# Patient Record
Sex: Male | Born: 1950 | Race: White | Hispanic: No | Marital: Married | Smoking: Never smoker
Health system: Southern US, Community
[De-identification: ages and names within clinical notes are randomized; demographics above are authoritative.]

## PROBLEM LIST (undated history)

## (undated) DIAGNOSIS — I480 Paroxysmal atrial fibrillation: Secondary | ICD-10-CM

## (undated) DIAGNOSIS — K219 Gastro-esophageal reflux disease without esophagitis: Secondary | ICD-10-CM

## (undated) DIAGNOSIS — G4730 Sleep apnea, unspecified: Secondary | ICD-10-CM

## (undated) DIAGNOSIS — I10 Essential (primary) hypertension: Secondary | ICD-10-CM

## (undated) DIAGNOSIS — D649 Anemia, unspecified: Secondary | ICD-10-CM

## (undated) DIAGNOSIS — D689 Coagulation defect, unspecified: Secondary | ICD-10-CM

## (undated) HISTORY — DX: Anemia, unspecified: D64.9

## (undated) HISTORY — DX: Coagulation defect, unspecified: D68.9

---

## 1984-11-19 HISTORY — PX: KNEE SURGERY: SHX244

## 2000-11-19 HISTORY — PX: APPENDECTOMY: SHX54

## 2004-11-19 HISTORY — PX: COLONOSCOPY: SHX174

## 2006-09-13 ENCOUNTER — Ambulatory Visit: Payer: Self-pay | Admitting: Gastroenterology

## 2006-12-13 ENCOUNTER — Ambulatory Visit: Payer: Self-pay | Admitting: Gastroenterology

## 2014-08-03 DIAGNOSIS — K219 Gastro-esophageal reflux disease without esophagitis: Secondary | ICD-10-CM | POA: Insufficient documentation

## 2014-08-03 DIAGNOSIS — E785 Hyperlipidemia, unspecified: Secondary | ICD-10-CM | POA: Insufficient documentation

## 2014-08-03 DIAGNOSIS — E8881 Metabolic syndrome: Secondary | ICD-10-CM | POA: Insufficient documentation

## 2014-08-03 DIAGNOSIS — J309 Allergic rhinitis, unspecified: Secondary | ICD-10-CM | POA: Insufficient documentation

## 2015-11-20 DIAGNOSIS — I82409 Acute embolism and thrombosis of unspecified deep veins of unspecified lower extremity: Secondary | ICD-10-CM

## 2015-11-20 HISTORY — DX: Acute embolism and thrombosis of unspecified deep veins of unspecified lower extremity: I82.409

## 2016-01-24 ENCOUNTER — Other Ambulatory Visit: Payer: Self-pay | Admitting: Family Medicine

## 2016-01-24 DIAGNOSIS — M79605 Pain in left leg: Secondary | ICD-10-CM

## 2016-01-27 ENCOUNTER — Ambulatory Visit
Admission: RE | Admit: 2016-01-27 | Discharge: 2016-01-27 | Disposition: A | Discharge status: Home / Self Care | Payer: BC Managed Care – PPO | Source: Ambulatory Visit | Attending: Family Medicine | Admitting: Family Medicine

## 2016-01-27 DIAGNOSIS — M79605 Pain in left leg: Secondary | ICD-10-CM | POA: Insufficient documentation

## 2016-01-27 DIAGNOSIS — I824Z2 Acute embolism and thrombosis of unspecified deep veins of left distal lower extremity: Secondary | ICD-10-CM | POA: Insufficient documentation

## 2017-02-05 DIAGNOSIS — Z86718 Personal history of other venous thrombosis and embolism: Secondary | ICD-10-CM | POA: Insufficient documentation

## 2017-05-23 ENCOUNTER — Encounter: Payer: Self-pay | Admitting: Oncology

## 2017-05-23 ENCOUNTER — Inpatient Hospital Stay: Payer: BC Managed Care – PPO | Attending: Oncology | Admitting: Oncology

## 2017-05-23 VITALS — BP 134/78 | HR 80 | Temp 98.8°F | Resp 18 | Ht 67.0 in | Wt 223.1 lb

## 2017-05-23 DIAGNOSIS — Z880 Allergy status to penicillin: Secondary | ICD-10-CM | POA: Diagnosis not present

## 2017-05-23 DIAGNOSIS — E8881 Metabolic syndrome: Secondary | ICD-10-CM | POA: Diagnosis not present

## 2017-05-23 DIAGNOSIS — Z809 Family history of malignant neoplasm, unspecified: Secondary | ICD-10-CM | POA: Diagnosis not present

## 2017-05-23 DIAGNOSIS — M1990 Unspecified osteoarthritis, unspecified site: Secondary | ICD-10-CM | POA: Diagnosis not present

## 2017-05-23 DIAGNOSIS — E785 Hyperlipidemia, unspecified: Secondary | ICD-10-CM | POA: Diagnosis not present

## 2017-05-23 DIAGNOSIS — Z86718 Personal history of other venous thrombosis and embolism: Secondary | ICD-10-CM | POA: Diagnosis not present

## 2017-05-23 DIAGNOSIS — E669 Obesity, unspecified: Secondary | ICD-10-CM

## 2017-05-23 DIAGNOSIS — K219 Gastro-esophageal reflux disease without esophagitis: Secondary | ICD-10-CM | POA: Diagnosis not present

## 2017-05-23 DIAGNOSIS — K649 Unspecified hemorrhoids: Secondary | ICD-10-CM | POA: Diagnosis not present

## 2017-05-23 DIAGNOSIS — I10 Essential (primary) hypertension: Secondary | ICD-10-CM | POA: Diagnosis not present

## 2017-05-23 DIAGNOSIS — Z79899 Other long term (current) drug therapy: Secondary | ICD-10-CM | POA: Diagnosis not present

## 2017-05-23 DIAGNOSIS — Z7901 Long term (current) use of anticoagulants: Secondary | ICD-10-CM | POA: Diagnosis not present

## 2017-05-23 DIAGNOSIS — I82502 Chronic embolism and thrombosis of unspecified deep veins of left lower extremity: Secondary | ICD-10-CM

## 2017-05-23 NOTE — Progress Notes (Signed)
Hematology/Oncology Consult note [facility] Telephone:([phone] [phone]  Patient Care Team: Derinda Late, MD as PCP - General (Family Medicine)   Name of the patient: Ryan [name]  [id number]  1951/10/21    Reason for referral- DVT   Referring physician- Dr. Baldemar Lenis  Date of visit: 05/23/17   History of presenting illness- patient is a 66 year old male with a past medical history significant for metabolic syndrome, hypertension, hyperlipidemia and GERD. Presented with left lower extremity pain in March 2017 and underwent an ultrasound of his left leg which showed an occlusive DVT in the calf veins, popliteal vein and femoral vein throughout the thigh.   Nonocclusive thrombus extends centrally into the common femoral vein. He is on Coumadin for the same. He has been sent to [place] for long-term anticoagulation recommendations  Currently he continues to take Coumadin and reports that his INR is within therapeutic limits. However he has been having on and off leaving him with his hemorrhoids. He also has issues with arthritis and would like to take anti-inflammatories as possible which she has not been able to do so as he is on Coumadin. Denies any prior history of blood clots before 2017. No family history of any blood clots. He did not have any long distance trip, surgeries or falls prior to his episode of DVT in 2017  ECOG PS- 0  Pain scale- 0   Review of systems- Review of Systems  Constitutional: Negative for chills, fever, malaise/fatigue and weight loss.  HENT: Negative for congestion, ear discharge and nosebleeds.   Eyes: Negative for blurred vision.  Respiratory: Negative for cough, hemoptysis, sputum production, shortness of breath and wheezing.   Cardiovascular: Negative for chest pain, palpitations, orthopnea and claudication.  Gastrointestinal: Negative for abdominal pain, blood in stool, constipation, diarrhea, heartburn, melena,  nausea and vomiting.  Genitourinary: Negative for dysuria, flank pain, frequency, hematuria and urgency.  Musculoskeletal: Negative for back pain, joint pain and myalgias.  Skin: Negative for rash.  Neurological: Negative for dizziness, tingling, focal weakness, seizures, weakness and headaches.  Endo/Heme/Allergies: Does not bruise/bleed easily.  Psychiatric/Behavioral: Negative for depression and suicidal ideas. The patient does not have insomnia.     Allergies  Allergen Reactions  . Amoxicillin-Pot Clavulanate Diarrhea  . Nitroglycerin Other (See Comments)    Sudden drop in BP    Patient Active Problem List   Diagnosis Date Noted  . History of deep venous thrombosis 02/05/2017  . Allergic rhinitis 08/03/2014  . Esophageal reflux 08/03/2014  . Metabolic syndrome 88/41/6606  . Other and unspecified hyperlipidemia 08/03/2014     Past Medical History:  Diagnosis Date  . Anemia   . Clotting disorder [facility])      Past Surgical History:  Procedure Laterality Date  . APPENDECTOMY    . COLONOSCOPY  2006  . KNEE SURGERY  1986    Social History   Social History  . Marital status: Married    Spouse name: N/A  . Number of children: N/A  . Years of education: N/A   Occupational History  . Not on file.   Social History Main Topics  . Smoking status: Not on file  . Smokeless tobacco: Not on file  . Alcohol use Not on file  . Drug use: Unknown  . Sexual activity: Not on file   Other Topics Concern  . Not on file   Social History Narrative  . No narrative on file     Family History  Problem Relation Age  of Onset  . Cancer Maternal Aunt   . Cancer Paternal Uncle     No current outpatient prescriptions on file.   Physical exam:  Vitals:   05/23/17 1405  BP: 134/78  Pulse: 80  Resp: 18  Temp: 98.8 F (37.1 C)  TempSrc: Tympanic  Weight: 223 lb 1 oz (101.2 kg)  Height: 5\' 7"  (1.702 m)   Physical Exam  Constitutional: He is oriented to person, place,  and time.  Mildly obese, no acute distress  HENT:  Head: Normocephalic and atraumatic.  Eyes: EOM are normal. Pupils are equal, round, and reactive to light.  Neck: Normal range of motion.  Cardiovascular: Normal rate, regular rhythm and normal heart sounds.   Pulmonary/Chest: Effort normal and breath sounds normal.  Abdominal: Soft. Bowel sounds are normal.  Musculoskeletal: He exhibits edema (trace left ankle edema).  Neurological: He is alert and oriented to person, place, and time.  Skin: Skin is warm and dry.       Assessment and plan- Patient is a 66 y.o. male referred to me for unprovoked LLE DVT  1. Discussed the results of the Doppler from March 2017 did not S showed distal lower extremity DVT but also proximal DVT involving the femoral vein which was seemingly unprovoked. His risk factors for recurrence include male sex, obesity, unprovoked proximal DVT. I will therefore highly recommend that he should be on indefinite anticoagulation while periodically weighing the risks and benefits of bleeding versus clotting. Although he has had on and off hemorrhoidal bleeding that has not been hemodynamically significant to cause anemia.   2. Hypercoagulable workup at this time would not change his management as he will need indefinite anticoagulation for above mentioned reasons.   3. Patient would like to think about his risks about stopping anticoagulation versus continuing anticoagulation and discuss this further with [facility] if he wants to come off anticoagulation. I would recommend checking D dimer 2 weeks after stopping coumadin and atleast putting him on low dose aspirin should he decide to come off anticoagulation.. If D dimer is high, his risk of recurrence for another DVT/PE would be even higher  4. Discussed pros and cons of oral anticoagulants xarelto, eliquis and pradaxa. Lesser risk of bleeding compared to coumadin, no monitoring required. Same dose regardless of body weight  (ideally not studied  In pateints >200lb but would be ok in his case) but lack of available reveresal agent for xarelto and eliquis.  5. Malignancy work up not recommended for unprovoked DVT but he should be uptodate with his colonoscopy.    Patient is leaning toward continuing anticoagulation but likely switching to one of the NOACs. He does not require f/u with me at this time   Thank you for this kind referral and the opportunity to participate in the care of this patient   Visit Diagnosis 1. Leg DVT (deep venous thromboembolism), chronic, left (HCC)     Dr. Randa Evens, MD, MPH [place] [facility] Pager- [phone] 05/23/2017  3:22 PM

## 2017-05-23 NOTE — Progress Notes (Signed)
Patient here today as new evaluation regarding lower left leg DVT.  Referred by Dr. Loney Hering.

## 2020-01-16 DIAGNOSIS — I639 Cerebral infarction, unspecified: Secondary | ICD-10-CM

## 2020-01-16 HISTORY — DX: Cerebral infarction, unspecified: I63.9

## 2020-02-24 ENCOUNTER — Other Ambulatory Visit: Payer: Self-pay

## 2020-02-24 ENCOUNTER — Ambulatory Visit: Payer: Medicare PPO | Admitting: Dermatology

## 2020-02-24 DIAGNOSIS — L918 Other hypertrophic disorders of the skin: Secondary | ICD-10-CM

## 2020-02-24 DIAGNOSIS — L719 Rosacea, unspecified: Secondary | ICD-10-CM

## 2020-02-24 DIAGNOSIS — L820 Inflamed seborrheic keratosis: Secondary | ICD-10-CM | POA: Diagnosis not present

## 2020-02-24 DIAGNOSIS — L739 Follicular disorder, unspecified: Secondary | ICD-10-CM

## 2020-02-24 DIAGNOSIS — B078 Other viral warts: Secondary | ICD-10-CM | POA: Diagnosis not present

## 2020-02-24 MED ORDER — CLINDAMYCIN PHOSPHATE 1 % EX SOLN: CUTANEOUS | 3 refills | Status: DC

## 2020-02-24 NOTE — Progress Notes (Signed)
   Follow-Up Visit   Subjective  Ryan [name] is a 69 y.o. male who presents for the following: lesions in the scalp (patient states that he has had this issue for about 10-12 years now. He has been diagnosed with different things by different physicians. He has tried Finacea, Clobetasol, Ketoconazole 2% shampoo, and antibiotics. Non of which helped. ), wart follow up (patient has been using Imiquimod at home but hasn't noticed an improvement. He did get a rash on his L inner arm where squaric acid was applied.), skin tags (irritated, patient would like to discuss treatment options), and recheck previously treated ISK's (patient states that the lesions did scab over but haven't resolved.).  The following portions of the chart were reviewed this encounter and updated as appropriate: Tobacco  Allergies  Meds  Problems  Med Hx  Surg Hx  Fam Hx     Review of Systems: No other skin or systemic complaints.  Objective  Well appearing patient in no apparent distress; mood and affect are within normal limits.  A focused examination was performed including face, trunk, and extremities. Relevant physical exam findings are noted in the Assessment and Plan.  Objective  Scalp: Crusts on the post neck   Objective  R arm x 7, L arm x 1 (8): Erythematous keratotic or waxy stuck-on papule or plaque.   Objective  L thumb: Verrucous papules -- Discussed viral etiology and contagion.   Objective  B/L axilla: Fleshy, skin-colored pedunculated papules.    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  Systemic medications may be more helpful but likely will only improve condition while he is actively taking.  They will have no permanent result.  Isotretinoin is the best option for more likely long-term good results.  Pt declines systemic tx at this  time.  Start Clindamycin sol. apply to aa's QD.  Discussed Isotretinoin (pt's son was on in the past), and oral Doxcycline.  Could also consider oral ivermectin, topical ivermectin and topical minocycline.  clindamycin (CLEOCIN T) 1 % external solution - Scalp  Inflamed seborrheic keratosis (8) R arm x 7, L arm x 1  Other viral warts L thumb  Advised patient that area is difficult to treat - (+) reaction to squaric acid sensitization.  Squaric acid 3% and [place] applied to aa then covered with Coban. Continue Imiquimod QHS.    Skin tag B/L axilla  Advised patient that removal may not be covered by insurance. I gave the patient the correct CPT and ICD-10 codes so he can verify with his insurance.   Return in about 6 weeks (around 04/06/2020).   Ryan Port, MD, am acting as scribe for Ryan Ser, MD .

## 2020-02-25 ENCOUNTER — Encounter: Payer: Self-pay | Admitting: Dermatology

## 2020-03-14 ENCOUNTER — Ambulatory Visit: Payer: Medicare PPO | Admitting: Dermatology

## 2020-04-06 ENCOUNTER — Other Ambulatory Visit: Payer: Self-pay

## 2020-04-06 ENCOUNTER — Ambulatory Visit: Payer: Medicare PPO | Admitting: Dermatology

## 2020-04-06 DIAGNOSIS — L820 Inflamed seborrheic keratosis: Secondary | ICD-10-CM | POA: Diagnosis not present

## 2020-04-06 DIAGNOSIS — Z1283 Encounter for screening for malignant neoplasm of skin: Secondary | ICD-10-CM | POA: Diagnosis not present

## 2020-04-06 DIAGNOSIS — D485 Neoplasm of uncertain behavior of skin: Secondary | ICD-10-CM | POA: Diagnosis not present

## 2020-04-06 DIAGNOSIS — L918 Other hypertrophic disorders of the skin: Secondary | ICD-10-CM | POA: Diagnosis not present

## 2020-04-06 DIAGNOSIS — B079 Viral wart, unspecified: Secondary | ICD-10-CM | POA: Diagnosis not present

## 2020-04-06 NOTE — Patient Instructions (Signed)

## 2020-04-06 NOTE — Progress Notes (Addendum)
Follow-Up Visit   Subjective  Ryan [name] is a 69 y.o. male who presents for the following: Skin Tag that are irritating and for which patient would like treatment.  (bilateral axilla),  Warts (left thumb - treating with topical imiquimod),  Follow-up (Inflamed SK - bilateral forearm treated with LN2),  Other (Mole of right side that jus sticks up a little.), and  Rash (Folliculitis of the post neck follow up - treating with Clindamycin solution). An upper body skin exam was performed for skin cancer screening and mole check.   The following portions of the chart were reviewed this encounter and updated as appropriate:  Tobacco  Allergies  Meds  Problems  Med Hx  Surg Hx  Fam Hx      Review of Systems:  No other skin or systemic complaints except as noted in HPI or Assessment and Plan.  Objective  Well appearing patient in no apparent distress; mood and affect are within normal limits.  All skin waist up examined.  Objective  Right forearm x 5, left forearm x 5 (10): Erythematous keratotic or waxy stuck-on papule or plaque.   Objective  Right mid side: 0.5 cm red papule  Objective  Left Axilla (12), Right Axilla (10): Fleshy, skin-colored pedunculated papules.    Objective  Left Thumb Tip: Verrucous papules -- Discussed viral etiology and contagion.    Assessment & Plan  Inflamed seborrheic keratosis (10) Right forearm x 5, left forearm x 5  May consider IL steroid injection for [facility] component.  Destruction of lesion - Right forearm x 5, left forearm x 5 Complexity: simple   Destruction method: cryotherapy   Informed consent: discussed and consent obtained   Timeout:  patient name, date of birth, surgical site, and procedure verified Lesion destroyed using liquid nitrogen: Yes   Region frozen until ice ball extended beyond lesion: Yes   Outcome: patient tolerated procedure well with no complications   Post-procedure details: wound care instructions  given    Neoplasm of uncertain behavior of skin Right mid side  Epidermal / dermal shaving  Lesion length (cm):  0.5 Lesion width (cm):  0.5 Margin per side (cm):  0.2 Total excision diameter (cm):  0.9 Informed consent: discussed and consent obtained   Timeout: patient name, date of birth, surgical site, and procedure verified   Procedure prep:  Patient was prepped and draped in usual sterile fashion Prep type:  Isopropyl alcohol Anesthesia: the lesion was anesthetized in a standard fashion   Anesthetic:  1% lidocaine w/ epinephrine 1-100,000 buffered w/ 8.4% NaHCO3 Instrument used: flexible razor blade   Hemostasis achieved with: pressure, aluminum chloride and electrodesiccation   Outcome: patient tolerated procedure well   Post-procedure details: sterile dressing applied and wound care instructions given   Dressing type: bandage and petrolatum    Specimen 1 - Surgical pathology Differential Diagnosis: Hemangioma vs other  Check Margins: No 0.5 cm red papule  Hemangioma vs other   Skin cancer screening  Skin tag (22) Left Axilla (12); Right Axilla (10)  Snip excision  - right axilla x 2                           left axilla x 3  Ln2 destruction - right axilla x 8                             left axilla x  9  Epidermal / dermal shaving - Left Axilla, Right Axilla  Informed consent: discussed and consent obtained   Patient was prepped and draped in usual sterile fashion: Area prepped with alcohol. Anesthesia: the lesion was anesthetized in a standard fashion   Anesthetic:  1% lidocaine w/ epinephrine 1-100,000 buffered w/ 8.4% NaHCO3 Instrument used: scissors   Hemostasis achieved with: pressure, aluminum chloride and electrodesiccation   Outcome: patient tolerated procedure well   Post-procedure details: wound care instructions given    Destruction of lesion - Left Axilla, Right Axilla Complexity: simple   Destruction method: cryotherapy   Informed consent:  discussed and consent obtained   Timeout:  patient name, date of birth, surgical site, and procedure verified Lesion destroyed using liquid nitrogen: Yes   Region frozen until ice ball extended beyond lesion: Yes   Outcome: patient tolerated procedure well with no complications   Post-procedure details: wound care instructions given    Viral warts, unspecified type Left Thumb Tip  3% squaric acid and Cantherone Plus applied today.  Destruction of lesion - Left Thumb Tip Complexity: simple   Destruction method: cryotherapy   Informed consent: discussed and consent obtained   Timeout:  patient name, date of birth, surgical site, and procedure verified Lesion destroyed using liquid nitrogen: Yes   Region frozen until ice ball extended beyond lesion: Yes   Outcome: patient tolerated procedure well with no complications   Post-procedure details: wound care instructions given    Return in about 3 months (around 07/07/2020).  I, Ashok Cordia, CMA, am acting as scribe for [name], MD .  Documentation: I have reviewed the above documentation for accuracy and completeness, and I agree with the above.  [name], MD

## 2020-04-10 ENCOUNTER — Encounter: Payer: Self-pay | Admitting: Dermatology

## 2020-04-11 NOTE — Progress Notes (Signed)
Follow-Up Visit   Subjective  Ryan [name] is a 69 y.o. male who presents for the following: Skin Tag that are irritating and for which patient would like treatment.  (bilateral axilla),  Warts (left thumb - treating with topical imiquimod),  Follow-up (Inflamed SK - bilateral forearm treated with LN2),  Other (Mole of right side that jus sticks up a little.), and  Rash (Folliculitis of the post neck follow up - treating with Clindamycin solution). An upper body skin exam was performed for skin cancer screening and mole check.   The following portions of the chart were reviewed this encounter and updated as appropriate:  Tobacco  Allergies  Meds  Problems  Med Hx  Surg Hx  Fam Hx      Review of Systems:  No other skin or systemic complaints except as noted in HPI or Assessment and Plan.  Objective  Well appearing patient in no apparent distress; mood and affect are within normal limits.  All skin waist up examined.  Objective  Right forearm x 5, left forearm x 5 (10): Erythematous keratotic or waxy stuck-on papule or plaque.   Objective  Right mid side: 0.5 cm red papule  Objective  Left Axilla (12), Right Axilla (10): Fleshy, skin-colored pedunculated papules.    Objective  Left Thumb Tip: Verrucous papules -- Discussed viral etiology and contagion.    Assessment & Plan  Inflamed seborrheic keratosis (10) Right forearm x 5, left forearm x 5  May consider IL steroid injection for [facility] component.  Destruction of lesion - Right forearm x 5, left forearm x 5 Complexity: simple   Destruction method: cryotherapy   Informed consent: discussed and consent obtained   Timeout:  patient name, date of birth, surgical site, and procedure verified Lesion destroyed using liquid nitrogen: Yes   Region frozen until ice ball extended beyond lesion: Yes   Outcome: patient tolerated procedure well with no complications   Post-procedure details: wound care instructions  given    Neoplasm of uncertain behavior of skin Right mid side  Epidermal / dermal shaving  Lesion length (cm):  0.5 Lesion width (cm):  0.5 Margin per side (cm):  0.2 Total excision diameter (cm):  0.9 Informed consent: discussed and consent obtained   Timeout: patient name, date of birth, surgical site, and procedure verified   Procedure prep:  Patient was prepped and draped in usual sterile fashion Prep type:  Isopropyl alcohol Anesthesia: the lesion was anesthetized in a standard fashion   Anesthetic:  1% lidocaine w/ epinephrine 1-100,000 buffered w/ 8.4% NaHCO3 Instrument used: flexible razor blade   Hemostasis achieved with: pressure, aluminum chloride and electrodesiccation   Outcome: patient tolerated procedure well   Post-procedure details: sterile dressing applied and wound care instructions given   Dressing type: bandage and petrolatum    Specimen 1 - Surgical pathology Differential Diagnosis: Hemangioma vs other  Check Margins: No 0.5 cm red papule  Hemangioma vs other   Skin cancer screening  Skin tag (22) Left Axilla (12); Right Axilla (10)  Snip excision  - right axilla x 2                           left axilla x 3  Ln2 destruction - right axilla x 8                             left axilla x  9  Epidermal / dermal shaving - Left Axilla, Right Axilla  Informed consent: discussed and consent obtained   Patient was prepped and draped in usual sterile fashion: Area prepped with alcohol. Anesthesia: the lesion was anesthetized in a standard fashion   Anesthetic:  1% lidocaine w/ epinephrine 1-100,000 buffered w/ 8.4% NaHCO3 Instrument used: scissors   Hemostasis achieved with: pressure, aluminum chloride and electrodesiccation   Outcome: patient tolerated procedure well   Post-procedure details: wound care instructions given    Destruction of lesion - Left Axilla, Right Axilla Complexity: simple   Destruction method: cryotherapy   Informed consent:  discussed and consent obtained   Timeout:  patient name, date of birth, surgical site, and procedure verified Lesion destroyed using liquid nitrogen: Yes   Region frozen until ice ball extended beyond lesion: Yes   Outcome: patient tolerated procedure well with no complications   Post-procedure details: wound care instructions given    Viral warts, unspecified type Left Thumb Tip  3% squaric acid and Cantherone Plus applied today.  Destruction of lesion - Left Thumb Tip Complexity: simple   Destruction method: cryotherapy   Informed consent: discussed and consent obtained   Timeout:  patient name, date of birth, surgical site, and procedure verified Lesion destroyed using liquid nitrogen: Yes   Region frozen until ice ball extended beyond lesion: Yes   Outcome: patient tolerated procedure well with no complications   Post-procedure details: wound care instructions given    Return in about 3 months (around 07/07/2020).  I, Ashok Cordia, CMA, am acting as scribe for [name], MD .  Documentation: I have reviewed the above documentation for accuracy and completeness, and I agree with the above.  [name], MD

## 2020-04-12 ENCOUNTER — Telehealth: Payer: Self-pay

## 2020-04-12 NOTE — Telephone Encounter (Signed)
Patient informed of pathology results 

## 2020-04-12 NOTE — Telephone Encounter (Signed)
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

## 2020-07-14 ENCOUNTER — Other Ambulatory Visit: Payer: Self-pay

## 2020-07-14 ENCOUNTER — Ambulatory Visit: Payer: Medicare PPO | Admitting: Dermatology

## 2020-07-14 DIAGNOSIS — Z9889 Other specified postprocedural states: Secondary | ICD-10-CM | POA: Diagnosis not present

## 2020-07-14 DIAGNOSIS — B079 Viral wart, unspecified: Secondary | ICD-10-CM

## 2020-07-14 DIAGNOSIS — Z86018 Personal history of other benign neoplasm: Secondary | ICD-10-CM | POA: Diagnosis not present

## 2020-07-14 NOTE — Progress Notes (Signed)
   Follow-Up Visit   Subjective  Ryan [name] is a 69 y.o. male who presents for the following: Warts (L thumb tip) and Follow-up (3 mo follow up ISKs bilateral forearms).  The following portions of the chart were reviewed this encounter and updated as appropriate: Tobacco  Allergies  Meds  Problems  Med Hx  Surg Hx  Fam Hx     Review of Systems: No other skin or systemic complaints except as noted in HPI or Assessment and Plan.   Objective  Well appearing patient in no apparent distress; mood and affect are within normal limits.  A focused examination was performed including arms, right side, and hands. Relevant physical exam findings are noted in the Assessment and Plan.  Objective  Left Thumb Eponychium: 0.6 cm verrucous papule  Objective  Right mid side: Benign hemangioma  Assessment & Plan  Viral warts, unspecified type Left Thumb Eponychium Destruction performed after consent with : 3% squaric acid and Cantherone Plus applied today.  Start 0.3% squaric acid at home 3 x per week. Continue Imiquimod on off days.  Consider biopsy if persistent in January.  Hx of excision of hemangioma Right mid side Discussed pathology results.   Seborrheic Keratoses - Stuck-on, waxy, tan-brown papules and plaques  - Discussed benign etiology and prognosis. - Observe - Call for any changes  Return in about 5 months (around 12/14/2020) for wart follow up.   IHarriett Sine, CMA, am acting as scribe for [name], MD.  Documentation: I have reviewed the above documentation for accuracy and completeness, and I agree with the above.  [name], MD

## 2020-07-22 ENCOUNTER — Encounter: Payer: Self-pay | Admitting: Dermatology

## 2020-08-04 ENCOUNTER — Other Ambulatory Visit: Payer: Self-pay

## 2020-08-04 ENCOUNTER — Ambulatory Visit: Payer: Medicare PPO | Admitting: Dermatology

## 2020-08-04 DIAGNOSIS — B079 Viral wart, unspecified: Secondary | ICD-10-CM

## 2020-08-04 DIAGNOSIS — L245 Irritant contact dermatitis due to other chemical products: Secondary | ICD-10-CM | POA: Diagnosis not present

## 2020-08-04 MED ORDER — TRIAMCINOLONE ACETONIDE 0.1 % EX CREA: TOPICAL_CREAM | CUTANEOUS | 0 refills | Status: DC

## 2020-08-04 NOTE — Progress Notes (Signed)
   Follow-Up Visit   Subjective  Ryan [name] is a 69 y.o. male who presents for the following: Rash (pt complaining of a rash that started on 9/13. Red, itchy, and sore patches on L cheek and all around neck.).  The following portions of the chart were reviewed this encounter and updated as appropriate: Tobacco  Allergies  Meds  Problems  Med Hx  Surg Hx  Fam Hx     Review of Systems: No other skin or systemic complaints except as noted in HPI or Assessment and Plan.   Objective  Well appearing patient in no apparent distress; mood and affect are within normal limits.  A focused examination was performed including head and neck. Relevant physical exam findings are noted in the Assessment and Plan.  Objective  Neck and face: Contact dermatitis caused by Squaric acid 3%.  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 Apply daily to affected areas daily as needed.  Advised pt to be sure that he is washing off the Squaric acid after removing the bandage before touching any part of his body.   triamcinolone cream (KENALOG) 0.1 % - Neck and face  Viral warts, unspecified type Left Thumb eponychium Continue 0.3% squaric acid at home 2-3 days per week. Continue Imiquimod on off days.   Return in 16 weeks (on 11/24/2020). As scheduled.  IHarriett Sine, CMA, am acting as scribe for [name], MD.  Documentation: I have reviewed the above documentation for accuracy and completeness, and I agree with the above.  [name], MD

## 2020-08-08 ENCOUNTER — Encounter: Payer: Self-pay | Admitting: Dermatology

## 2020-11-14 ENCOUNTER — Observation Stay
Admission: EM | Admit: 2020-11-14 | Discharge: 2020-11-16 | Disposition: A | Discharge status: Home / Self Care | Payer: Medicare PPO | Attending: Internal Medicine | Admitting: Internal Medicine

## 2020-11-14 ENCOUNTER — Inpatient Hospital Stay: Payer: Medicare PPO

## 2020-11-14 ENCOUNTER — Encounter: Payer: Self-pay | Admitting: Emergency Medicine

## 2020-11-14 ENCOUNTER — Emergency Department: Payer: Medicare PPO

## 2020-11-14 ENCOUNTER — Other Ambulatory Visit: Payer: Self-pay

## 2020-11-14 DIAGNOSIS — R4702 Dysphasia: Secondary | ICD-10-CM | POA: Diagnosis not present

## 2020-11-14 DIAGNOSIS — R531 Weakness: Secondary | ICD-10-CM | POA: Diagnosis present

## 2020-11-14 DIAGNOSIS — G8191 Hemiplegia, unspecified affecting right dominant side: Secondary | ICD-10-CM | POA: Insufficient documentation

## 2020-11-14 DIAGNOSIS — Z20822 Contact with and (suspected) exposure to covid-19: Secondary | ICD-10-CM | POA: Diagnosis not present

## 2020-11-14 DIAGNOSIS — I6389 Other cerebral infarction: Secondary | ICD-10-CM

## 2020-11-14 DIAGNOSIS — K219 Gastro-esophageal reflux disease without esophagitis: Secondary | ICD-10-CM

## 2020-11-14 DIAGNOSIS — Z79899 Other long term (current) drug therapy: Secondary | ICD-10-CM | POA: Insufficient documentation

## 2020-11-14 DIAGNOSIS — I6350 Cerebral infarction due to unspecified occlusion or stenosis of unspecified cerebral artery: Secondary | ICD-10-CM | POA: Diagnosis not present

## 2020-11-14 DIAGNOSIS — R4781 Slurred speech: Secondary | ICD-10-CM

## 2020-11-14 DIAGNOSIS — I639 Cerebral infarction, unspecified: Secondary | ICD-10-CM | POA: Diagnosis present

## 2020-11-14 DIAGNOSIS — R4701 Aphasia: Secondary | ICD-10-CM | POA: Diagnosis not present

## 2020-11-14 LAB — PROTIME-INR
INR: 1 (ref 0.8–1.2)
INR: 1.1 (ref 0.8–1.2)
Prothrombin Time: 13.1 seconds (ref 11.4–15.2)
Prothrombin Time: 13.5 seconds (ref 11.4–15.2)

## 2020-11-14 LAB — URINALYSIS, ROUTINE W REFLEX MICROSCOPIC
Bilirubin Urine: NEGATIVE
Glucose, UA: 50 mg/dL — AB
Hgb urine dipstick: NEGATIVE
Ketones, ur: 5 mg/dL — AB
Leukocytes,Ua: NEGATIVE
Nitrite: NEGATIVE
Protein, ur: NEGATIVE mg/dL
Specific Gravity, Urine: 1.036 — ABNORMAL HIGH (ref 1.005–1.030)
pH: 5 (ref 5.0–8.0)

## 2020-11-14 LAB — COMPREHENSIVE METABOLIC PANEL
ALT: 22 U/L (ref 0–44)
AST: 26 U/L (ref 15–41)
Albumin: 3.6 g/dL (ref 3.5–5.0)
Alkaline Phosphatase: 35 U/L — ABNORMAL LOW (ref 38–126)
Anion gap: 8 (ref 5–15)
BUN: 16 mg/dL (ref 8–23)
CO2: 25 mmol/L (ref 22–32)
Calcium: 8.6 mg/dL — ABNORMAL LOW (ref 8.9–10.3)
Chloride: 107 mmol/L (ref 98–111)
Creatinine, Ser: 0.95 mg/dL (ref 0.61–1.24)
GFR, Estimated: >60 mL/min (ref >60)
Glucose, Bld: 160 mg/dL — ABNORMAL HIGH (ref 70–99)
Potassium: 4.6 mmol/L (ref 3.5–5.1)
Sodium: 140 mmol/L (ref 135–145)
Total Bilirubin: 0.6 mg/dL (ref 0.3–1.2)
Total Protein: 7.2 g/dL (ref 6.5–8.1)

## 2020-11-14 LAB — DIFFERENTIAL
Abs Immature Granulocytes: 0.04 10*3/uL (ref 0.00–0.07)
Basophils Absolute: 0.1 10*3/uL (ref 0.0–0.1)
Basophils Relative: 1 %
Eosinophils Absolute: 0.3 10*3/uL (ref 0.0–0.5)
Eosinophils Relative: 3 %
Immature Granulocytes: 0 %
Lymphocytes Relative: 14 %
Lymphs Abs: 1.4 10*3/uL (ref 0.7–4.0)
Monocytes Absolute: 0.6 10*3/uL (ref 0.1–1.0)
Monocytes Relative: 6 %
Neutro Abs: 7.7 10*3/uL (ref 1.7–7.7)
Neutrophils Relative %: 76 %

## 2020-11-14 LAB — CBC
HCT: 44 % (ref 39.0–52.0)
Hemoglobin: 14.4 g/dL (ref 13.0–17.0)
MCH: 29.9 pg (ref 26.0–34.0)
MCHC: 32.7 g/dL (ref 30.0–36.0)
MCV: 91.5 fL (ref 80.0–100.0)
Platelets: 186 10*3/uL (ref 150–400)
RBC: 4.81 MIL/uL (ref 4.22–5.81)
RDW: 13.2 % (ref 11.5–15.5)
WBC: 10.1 10*3/uL (ref 4.0–10.5)
nRBC: 0 % (ref 0.0–0.2)

## 2020-11-14 LAB — URINE DRUG SCREEN, QUALITATIVE (ARMC ONLY)
Amphetamines, Ur Screen: NOT DETECTED
Barbiturates, Ur Screen: NOT DETECTED
Benzodiazepine, Ur Scrn: NOT DETECTED
Cannabinoid 50 Ng, Ur ~~LOC~~: NOT DETECTED
Cocaine Metabolite,Ur ~~LOC~~: NOT DETECTED
MDMA (Ecstasy)Ur Screen: NOT DETECTED
Methadone Scn, Ur: NOT DETECTED
Opiate, Ur Screen: NOT DETECTED
Phencyclidine (PCP) Ur S: NOT DETECTED
Tricyclic, Ur Screen: NOT DETECTED

## 2020-11-14 LAB — CBG MONITORING, ED: Glucose-Capillary: 153 mg/dL — ABNORMAL HIGH (ref 70–99)

## 2020-11-14 LAB — RESP PANEL BY RT-PCR (FLU A&B, COVID) ARPGX2
Influenza A by PCR: NEGATIVE
Influenza B by PCR: NEGATIVE
SARS Coronavirus 2 by RT PCR: NEGATIVE

## 2020-11-14 LAB — APTT: aPTT: 26 seconds (ref 24–36)

## 2020-11-14 LAB — ETHANOL: Alcohol, Ethyl (B): <10 mg/dL (ref <10)

## 2020-11-14 LAB — TSH: TSH: 2.587 u[IU]/mL (ref 0.350–4.500)

## 2020-11-14 MED ORDER — ONDANSETRON HCL 4 MG/2ML IJ SOLN: 4.0000 mg | Freq: Four times a day (QID) | INTRAMUSCULAR | Status: DC | PRN

## 2020-11-14 MED ORDER — ASPIRIN 325 MG PO TABS
325.0000 mg | ORAL_TABLET | Freq: Every day | ORAL | Status: DC
  Filled 2020-11-14: qty 1

## 2020-11-14 MED ORDER — FERROUS SULFATE DRIED ER 45 MG PO TBCR: 1.0000 | EXTENDED_RELEASE_TABLET | Freq: Every day | ORAL | Status: DC

## 2020-11-14 MED ORDER — TRAZODONE HCL 50 MG PO TABS
25.0000 mg | ORAL_TABLET | Freq: Every evening | ORAL | Status: DC | PRN
  Administered 2020-11-15: 22:00:00 25 mg via ORAL
  Filled 2020-11-14: qty 1

## 2020-11-14 MED ORDER — SODIUM CHLORIDE 0.9 % IV SOLN: INTRAVENOUS | Status: DC

## 2020-11-14 MED ORDER — ASPIRIN 300 MG RE SUPP: 300.0000 mg | Freq: Every day | RECTAL | Status: DC

## 2020-11-14 MED ORDER — LORATADINE 10 MG PO TABS
10.0000 mg | ORAL_TABLET | Freq: Every day | ORAL | Status: DC
  Administered 2020-11-15: 22:00:00 10 mg via ORAL
  Filled 2020-11-14: qty 1

## 2020-11-14 MED ORDER — GABAPENTIN 100 MG PO CAPS
100.0000 mg | ORAL_CAPSULE | Freq: Three times a day (TID) | ORAL | Status: DC
  Administered 2020-11-14 – 2020-11-16 (×5): 100 mg via ORAL
  Filled 2020-11-14 (×5): qty 1

## 2020-11-14 MED ORDER — ATORVASTATIN CALCIUM 20 MG PO TABS
40.0000 mg | ORAL_TABLET | Freq: Every day | ORAL | Status: DC
  Administered 2020-11-15: 22:00:00 40 mg via ORAL
  Filled 2020-11-14: qty 2

## 2020-11-14 MED ORDER — ENOXAPARIN SODIUM 40 MG/0.4ML ~~LOC~~ SOLN: 40.0000 mg | SUBCUTANEOUS | Status: DC

## 2020-11-14 MED ORDER — ONDANSETRON HCL 4 MG PO TABS: 4.0000 mg | ORAL_TABLET | Freq: Four times a day (QID) | ORAL | Status: DC | PRN

## 2020-11-14 MED ORDER — ACETAMINOPHEN 325 MG PO TABS: 650.0000 mg | ORAL_TABLET | Freq: Four times a day (QID) | ORAL | Status: DC | PRN

## 2020-11-14 MED ORDER — PANTOPRAZOLE SODIUM 40 MG PO TBEC
40.0000 mg | DELAYED_RELEASE_TABLET | Freq: Every day | ORAL | Status: DC
  Administered 2020-11-15 – 2020-11-16 (×2): 40 mg via ORAL
  Filled 2020-11-14 (×2): qty 1

## 2020-11-14 MED ORDER — VITAMIN D3 25 MCG (1000 UNIT) PO TABS
2000.0000 [IU] | ORAL_TABLET | Freq: Every day | ORAL | Status: DC
  Administered 2020-11-15 – 2020-11-16 (×2): 2000 [IU] via ORAL
  Filled 2020-11-14 (×4): qty 2

## 2020-11-14 MED ORDER — ENOXAPARIN SODIUM 60 MG/0.6ML ~~LOC~~ SOLN
0.5000 mg/kg | SUBCUTANEOUS | Status: DC
  Administered 2020-11-14 – 2020-11-15 (×2): 50 mg via SUBCUTANEOUS
  Filled 2020-11-14 (×2): qty 0.6

## 2020-11-14 MED ORDER — IOHEXOL 350 MG/ML SOLN
75.0000 mL | Freq: Once | INTRAVENOUS | Status: AC | PRN
  Administered 2020-11-14: 19:00:00 75 mL via INTRAVENOUS

## 2020-11-14 MED ORDER — STROKE: EARLY STAGES OF RECOVERY BOOK: Freq: Once | Status: AC

## 2020-11-14 MED ORDER — ACETAMINOPHEN 650 MG RE SUPP: 650.0000 mg | Freq: Four times a day (QID) | RECTAL | Status: DC | PRN

## 2020-11-14 MED ORDER — MAGNESIUM HYDROXIDE 400 MG/5ML PO SUSP
30.0000 mL | Freq: Every day | ORAL | Status: DC | PRN
  Filled 2020-11-14: qty 30

## 2020-11-14 MED ORDER — ASPIRIN 325 MG PO TABS
325.0000 mg | ORAL_TABLET | Freq: Every day | ORAL | Status: DC
  Administered 2020-11-14 – 2020-11-16 (×3): 325 mg via ORAL
  Filled 2020-11-14 (×4): qty 1

## 2020-11-14 MED ORDER — FERROUS SULFATE 325 (65 FE) MG PO TABS
325.0000 mg | ORAL_TABLET | Freq: Every day | ORAL | Status: DC
  Administered 2020-11-15 – 2020-11-16 (×2): 325 mg via ORAL
  Filled 2020-11-14 (×3): qty 1

## 2020-11-14 MED ORDER — VITAMIN B-12 1000 MCG PO TABS
1000.0000 ug | ORAL_TABLET | Freq: Every day | ORAL | Status: DC
  Administered 2020-11-15 – 2020-11-16 (×2): 1000 ug via ORAL
  Filled 2020-11-14 (×3): qty 1

## 2020-11-14 NOTE — Progress Notes (Signed)
[place] visited pt. per PG for [place]; pt. being attended to by RN and neurologist (via video link) when [facility] arrived; pt.'s son at bedside.  Pt. shared he went out to his garage and suddenly began feeling weak, unable to stand due to weakness in his right side.  Son found him sitting in garage 29min later and EMS brought him to ED.  Pt. currently waiting for further observation and MRI.  No needs expressed at present.  [place] remains available.

## 2020-11-14 NOTE — Consult Note (Signed)
[facility] Telemedicine Consult  Requesting Provider: Vladimir Crofts Consult Participants: Nurse Lourdes Sledge, son, patient Location of the provider: [place], [place] Location of the patient: [facility] ED bed 13  This consult was provided via telemedicine with 2-way video and audio communication. The patient/family was informed that care would be provided in this way and agreed to receive care in this manner.   History is obtained from the patient, son, and chart review.   Chief Complaint: Right-sided weakness   HPI: The patient is a 69 year old gentleman with a past medical history significant for unprovoked DVT (2017, on Eliquis with last dose 12/20 7 AM), GERD, allergic rhinitis and eustachian tube dysfunction, hypertension not on any medications, metabolic syndrome, idiopathic peripheral neuropathy  He was in his usual state of health today until around 4:30 PM when he began to feel very unwell and could not move the right side of his body.  He therefore sat down where he was but was unable to reach out for help because he did not have his phone near him.  His son found him at 5 PM (had last seen his father well at about 4 PM).  The patient told his son he thought he might have had a stroke and had minimal movement of the right side of his body.  He was not having any issues with language comprehension or production though his speech was slurred.  He did not seem to have any difficulty speaking.  EMS was activated, and the patient presented as a code stroke.  By the time of my evaluation, his symptoms were rapidly improving with some mild residual right hand weakness and mild slurred speech.  He confirmed that he was taking his Eliquis for unprovoked DVT and had last taken a dose the morning of his presentation.  He also notes he takes gabapentin 100 mg 3 times daily for trigeminal neuralgia (had about in the 1990s, was weaned off medications and then had a second bout starting in 2019 which  remains well controlled when he continues his gabapentin).  Review of systems is additionally notable for some right ear dysfunction; he reports he thinks his tube in that ear was dislodged due to coughing from his bronchitis in October/November and this is planned for replacement in the near future.  He denies any prior episodes like this and denies any sensory changes, language issues, or vision issues with this episode.  He does denies any acute changes in his hearing not explained by the tube issues as above.  There were no shaking movements or incontinence.  He additionally takes Tylenol 1000 mg 3 times daily for chronic pain (arthritis)  LKW: 4 PM tpa given?: No, due to therapeutic anticoagulation IR Thrombectomy? No, due to exam not consistent with large vessel occlusion Modified Rankin Scale: 0-Completely asymptomatic and back to baseline post- stroke Time of teleneurologist evaluation: 6:10 PM  Exam: Vitals:   11/14/20 1752 11/14/20 1758  BP:  (!) 151/82  Pulse:    Resp:    Temp: 97.6 F (36.4 C)   SpO2:  96%    General:  Alert, awake, oriented to person, time, situation Cooperative and appropriate Regular rhythm on monitor Breathing comfortably  1A: Level of Consciousness - 0  1B: Ask Month and Age - 0 1C: 'Blink Eyes' & 'Squeeze Hands' - 0 2: Test Horizontal Extraocular Movements - 0 3: Test Visual Fields - 0 4: Test Facial Palsy - 0 5A: Test Left Arm Motor Drift - 0 5B: Test  Right Arm Motor Drift - 1 6A: Test Left Leg Motor Drift - 0 6B: Test Right Leg Motor Drift - 0 7: Test Limb Ataxia - 0 (within limits of weakness on right) 8: Test Sensation - 0 9: Test Language/Aphasia- 0 10: Test Dysarthria - 1 11: Test Extinction/Inattention - 0 NIHSS score: 2   Imaging Reviewed:    Labs reviewed in epic and pertinent values follow: Cr 0.95 CBC WNL  Assessment: This is a 69 year old gentleman with minimal prior vascular risk factors other than obesity presenting  with signs and symptoms consistent with a left lacunar stroke.  No tPA due to Eliquis, not an IA candidate given no evidence of LVO on clinical exam.  Will admit for stroke work-up, plan as below.  Patient was additionally counseled on the stuttering nature of lacunar strokes and that he may have a fluctuating course, for which we would repeat imaging (STAT head CT) if he were to worsen to confirm no hemorrhagic transformation especially given his Eliquis use.  However given the small size of his stroke based on clinical examination, I do not expect he is at high risk for hemorrhagic transformation at this time.  Recommendations:   # Left lacunar stroke - Stroke labs TSH, ESR, RPR, HgbA1c, fasting lipid panel - MRI brain  - CTA head and neck - Frequent neuro checks, STAT HCT for any change in neurological status - Echocardiogram - Prophylactic therapy-Antiplatelet med: Aspirin - dose 332m PO or 3065mPR daily [facility] - Atorvastatin 40 mg QHS, may increase if LDL very high  - Hold plavix given plan to resume AC pending MRI   - Risk factor modification - Telemetry monitoring; 30 day event monitor on discharge if no arrythmias captured  - Blood pressure goal   - Normotension, to be reached gradually - PT consult, OT consult, Speech consult,  - Neurology to follow   This patient is receiving care for possible acute neurological changes. There was 40 minutes of care by this provider at the time of service, including time for direct evaluation via telemedicine, review of medical records, imaging studies and discussion of findings with providers, the patient and/or family.  SrLesleigh NoeD-PhD [facility] [phone] If 7pm- 7am, please page neurology on call as [place] in [place]

## 2020-11-14 NOTE — ED Notes (Signed)
Dr Iver Nestle, tele-neurologist is present.

## 2020-11-14 NOTE — Progress Notes (Signed)
PHARMACIST - PHYSICIAN COMMUNICATION  CONCERNING:  Enoxaparin (Lovenox) for DVT Prophylaxis    RECOMMENDATION: Patient was prescribed enoxaprin 40mg  q24 hours for VTE prophylaxis.   Filed Weights   11/14/20 1752  Weight: 99.8 kg (220 lb)    Body mass index is 35.51 kg/m.  Estimated Creatinine Clearance: 83.5 mL/min (by C-G formula based on SCr of 0.95 mg/dL).   Based on [facility] policy patient is candidate for enoxaparin 0.5mg /kg TBW SQ every 24 hours based on BMI being >30.   DESCRIPTION: Pharmacy has adjusted enoxaparin dose per [facility] policy.  Patient is now receiving enoxaparin 50 mg every 24 hours    [facility], PharmD Clinical Pharmacist  11/14/2020 8:31 PM

## 2020-11-14 NOTE — ED Notes (Signed)
Report given by this RN and Mimi RN to [employer].

## 2020-11-14 NOTE — ED Provider Notes (Signed)
[facility] Emergency Department Provider Note ____________________________________________   Event Date/Time   First MD Initiated Contact with Patient 11/14/20 1752     (approximate)  I have reviewed the triage vital signs and the nursing notes.  HISTORY  Chief Complaint Weakness   HPI Ryan [name] is a 69 y.o. 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  Otherwise history of GERD, trigeminal neuralgia  Patient presents to the ED with less than 2 hours of sudden onset right-sided extremity weakness and slurred speech.  Patient reports being in his typical state of health without recent illnesses, until he suddenly developed generalized weakness and nausea, with associated right-sided weakness and slurred speech.  Denies any falls, trauma or syncope.  EMS reports that symptoms were improving upon their arrival.   Patient denies pain or any symptoms when he presents to the ED.  Reports he still feels some residual "weirdness"   Past Medical History:  Diagnosis Date  . Anemia   . Clotting disorder [facility])     Patient Active Problem List   Diagnosis Date Noted  . History of deep venous thrombosis 02/05/2017  . Allergic rhinitis 08/03/2014  . Esophageal reflux 08/03/2014  . Metabolic syndrome [id number]  . Other and unspecified hyperlipidemia 08/03/2014    Past Surgical History:  Procedure Laterality Date  . APPENDECTOMY    . COLONOSCOPY  2006  . [place]    Prior to Admission medications   Medication Sig Start Date End Date Taking? Authorizing Provider  Acetaminophen (TYLENOL ARTHRITIS PAIN PO) Take 650 mg by mouth 3 (three) times daily.    [provider]  Cetirizine HCl 10 MG CAPS Take 10 mg by mouth daily.    [provider]  Cholecalciferol (VITAMIN D3) 2000 units capsule Take 2,000 Units by mouth daily.    [provider]  clindamycin (CLEOCIN T) 1 % external solution Apply to aa's scalp and neck QD 02/24/20   Ralene Bathe, MD  ELIQUIS 5 MG TABS tablet SMARTSIG:1 Tablet(s) By Mouth Every 12 Hours 02/16/20   [provider]  Ferrous Sulfate Dried (SLOW RELEASE IRON) 45 MG TBCR Take 1 tablet by mouth daily.    [provider]  fluticasone (FLONASE) 50 MCG/ACT nasal spray Place 2 sprays into the nose daily. 02/02/16   [provider]  gabapentin (NEURONTIN) 100 MG capsule Take 100 mg by mouth 3 (three) times daily. 07/11/20   [provider]  omeprazole (PRILOSEC) 20 MG capsule Take 20 mg by mouth daily. 03/09/17   [provider]  perphenazine-amitriptyline (ETRAFON/TRIAVIL) 2-10 MG TABS tablet Take 1 tablet by mouth daily. 03/05/17   [provider]  traMADol (ULTRAM) 50 MG tablet Take 50 mg by mouth 2 (two) times daily. 03/05/17   [provider]  triamcinolone cream (KENALOG) 0.1 % Apply topically to aa's daily as needed. 08/04/20   Ralene Bathe, MD  vitamin B-12 (CYANOCOBALAMIN) 1000 MCG tablet Take 1,000 mcg by mouth daily.    [provider]  warfarin (COUMADIN) 2 MG tablet Take 2 mg by mouth daily. 03/06/17 03/06/18  [provider]    Allergies Amoxicillin-pot clavulanate and Nitroglycerin  Family History  Problem Relation Age of Onset  . Cancer Maternal Aunt   . Cancer Paternal Uncle  Social History Social History   Tobacco Use  . Smoking status: Never Smoker  . Smokeless tobacco: Never Used  Substance Use Topics  . Alcohol use: Yes    Review of Systems  Constitutional: No fever/chills.  Positive generalized weakness Eyes: No visual changes. ENT: No sore throat. Cardiovascular: Denies chest pain. Respiratory: Denies shortness of breath. Gastrointestinal: No abdominal pain.  No nausea, no  vomiting.  No diarrhea.  No constipation. Genitourinary: Negative for dysuria. Musculoskeletal: Negative for back pain. Skin: Negative for rash. Neurological: Negative for headaches.  Positive for right-sided weakness and slurred speech  ____________________________________________   PHYSICAL EXAM:  VITAL SIGNS: Vitals:   11/14/20 1830 11/14/20 1845  BP: (!) 153/79 (!) 147/80  Pulse: 70 71  Resp: (!) 24 16  Temp:    SpO2: 97% 98%     Constitutional: Alert and oriented. Well appearing and in no acute distress.  Conversational in full sentences, follows commands in all 4 extremities.  Slightly slurred speech noted. Eyes: Conjunctivae are normal. PERRL. EOMI. Head: Atraumatic. Nose: No congestion/rhinnorhea. Mouth/Throat: Mucous membranes are moist.  Oropharynx non-erythematous. Neck: No stridor. No cervical spine tenderness to palpation. Cardiovascular: Normal rate, regular rhythm. Grossly normal heart sounds.  Good peripheral circulation. Respiratory: Normal respiratory effort.  No retractions. Lungs CTAB. Gastrointestinal: Soft , nondistended, nontender to palpation. No CVA tenderness. Musculoskeletal: No lower extremity tenderness nor edema.  No joint effusions. No signs of acute trauma. Neurologic: Mild dysarthria and 4/5 strength to right arm and right leg.  Otherwise neurologically intact. Skin:  Skin is warm, dry and intact. No rash noted. Psychiatric: Mood and affect are normal. Speech and behavior are normal.  ____________________________________________   LABS (all labs ordered are listed, but only abnormal results are displayed)  Labs Reviewed  COMPREHENSIVE METABOLIC PANEL - Abnormal; Notable for the following components:      Result Value   Glucose, Bld 160 (*)    Calcium 8.6 (*)    Alkaline Phosphatase 35 (*)    All other components within normal limits  CBG MONITORING, ED - Abnormal; Notable for the following components:   Glucose-Capillary 153 (*)    All  other components within normal limits  RESP PANEL BY RT-PCR (FLU A&B, COVID) ARPGX2  PROTIME-INR  DIFFERENTIAL  CBC  ETHANOL  URINE DRUG SCREEN, QUALITATIVE ([facility] ONLY)  URINALYSIS, ROUTINE W REFLEX MICROSCOPIC  SEDIMENTATION RATE  C-REACTIVE PROTEIN  TSH  RPR  HEMOGLOBIN A1C  LIPID PANEL  HIV ANTIBODY (ROUTINE TESTING W REFLEX)  RAPID HIV SCREEN (HIV 1/2 AB+AG)  PROTIME-INR  APTT   ____________________________________________  12 Lead EKG  68 bpm.  Normal axis and intervals.  No evidence of acute ischemia. ____________________________________________  RADIOLOGY  ED MD interpretation: CT head reviewed by me without evidence of acute ICH  Official radiology report(s): CT HEAD CODE STROKE WO CONTRAST  Result Date: 11/14/2020 CLINICAL DATA:  Code stroke.  Slurred speech, right arm drift EXAM: CT HEAD WITHOUT CONTRAST TECHNIQUE: Contiguous axial images were obtained from the base of the skull through the vertex without intravenous contrast. COMPARISON:  None. FINDINGS: Brain: There is no acute intracranial hemorrhage, mass effect, or edema. Gray-white differentiation is preserved. Ventricles and sulci are normal in size and configuration. There is no hydrocephalus or extra-axial collection. Vascular: No hyperdense vessel. There is intracranial atherosclerotic calcification at the skull base. Skull: Unremarkable. Sinuses/Orbits: Opacification of the visualized right maxillary sinus. Near total opacification of right frontal and anterior ethmoid air cells. No acute abnormality of the  orbits. Other: Opacification of the inferior right mastoid air cells. ASPECTS ([place] Stroke Program Early CT Score) - Ganglionic level infarction (caudate, lentiform nuclei, internal capsule, insula, M1-M3 cortex): 7 - Supraganglionic infarction (M4-M6 cortex): 3 Total score (0-10 with 10 being normal): 10 IMPRESSION: There is no acute intracranial hemorrhage or evidence of acute infarction. ASPECT score is  10. 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 Superimposed acute sinusitis is possible in the appropriate clinical setting. These results were called by telephone at the time of interpretation on 11/14/2020 at 6:11 pm to provider [facility] , who verbally acknowledged these results. Electronically Signed   By: Macy Mis M.D.   On: 11/14/2020 18:15    ____________________________________________   PROCEDURES and INTERVENTIONS  Procedure(s) performed (including Critical Care):  .1-3 Lead EKG Interpretation Performed by: Vladimir Crofts, MD Authorized by: Vladimir Crofts, MD     Interpretation: normal     ECG rate:  72   ECG rate assessment: normal     Rhythm: sinus rhythm     Ectopy: none     Conduction: normal   .Critical Care Performed by: Vladimir Crofts, MD Authorized by: Vladimir Crofts, MD   Critical care provider statement:    Critical care time (minutes):  45   Critical care was necessary to treat or prevent imminent or life-threatening deterioration of the following conditions:  CNS failure or compromise and sepsis   Critical care was time spent personally by me on the following activities:  Discussions with consultants, evaluation of patient's response to treatment, examination of patient, ordering and performing treatments and interventions, ordering and review of laboratory studies, ordering and review of radiographic studies, pulse oximetry, re-evaluation of patient's condition, obtaining history from patient or surrogate and review of old charts     Medications  aspirin tablet 325 mg (has no administration in time range)  atorvastatin (LIPITOR) tablet 40 mg (has no administration in time range)  iohexol (OMNIPAQUE) 350 MG/ML injection 75 mL (has no administration in time range)    ____________________________________________   MDM / ED COURSE   69 year old male on Eliquis for previous DVT presents to the ED  with 2 hours of right-sided weakness and slurred speech consistent with acute CVA requiring medical admission, but not a TPA candidate.  Hemodynamically stable.  Exam with mild and residual right-sided weakness as well as dysarthria, without evidence of trauma or additional acute pathology.  Stroke alert called due to his persistent, although improving, deficits.  CT without intracranial hemorrhage.  Blood work unremarkable.  We will admit to hospitalist medicine for further work-up and management of his clinical stroke.   Clinical Course as of 11/14/20 1908  Mon Nov 14, 2020  1811 Call from rads, no bleed [DS]    Clinical Course User Index [DS] Vladimir Crofts, MD    ____________________________________________   FINAL CLINICAL IMPRESSION(S) / ED DIAGNOSES  Final diagnoses:  Cerebrovascular accident (CVA) due to other mechanism [facility])  Right sided weakness  Slurred speech     ED Discharge Orders    None       Julyanna Scholle Tamala Julian   Note:  This document was prepared using Dragon voice recognition software and may include unintentional dictation errors.   Vladimir Crofts, MD 11/14/20 [phone]

## 2020-11-14 NOTE — ED Triage Notes (Signed)
Pt to ED via EMS. Pt last known normal was 1600. EMS stated that patient had slurred speech and right arm drift. No facial droop. Pt has been taking coumadin but has recently switched to eliquis. Pt BP ranged from  168/80- 163/70. Pt cbg was 163. Pt has improved upon arrival per EMS.

## 2020-11-14 NOTE — ED Notes (Signed)
Pt son and chaplain at bedside.

## 2020-11-14 NOTE — H&P (Signed)
[place]   PATIENT NAME: Ryan [name]    MR#:  [id number]  DATE OF BIRTH:  03/01/1951  DATE OF ADMISSION:  11/14/2020  PRIMARY CARE PHYSICIAN: Derinda Late, MD   REQUESTING/REFERRING PHYSICIAN: Vladimir Crofts, MD  CHIEF COMPLAINT:   Chief Complaint  Patient presents with  . Weakness    HISTORY OF PRESENT ILLNESS:  Ryan [name]  is a 69 y.o. 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  No witnessed seizures.  No urinary or stool incontinence.  He admitted to mild dizziness.  No dyspnea or cough or wheezing or hemoptysis.  No chest pain or palpitations.  No fever or chills.  His speech has been improving the ER as well as his right sided weakness.  He was seen by teleneurology in the ER.  Upon presentation to the ER, blood pressure was 151/82 with otherwise normal vital signs.  Labs revealed blood glucose 169 otherwise unremarkable CMP and CBC.  TSH was 2.5 influenza antigens and COVID-19 PCR came back negative.  Urine drug screen was negative and UA showed 50 glucose and 5 ketones with increased specific gravity.  Alcohol levels less than 10 initial noncontrasted head CT scan showed no acute intracranial normalities.  There was likely chronic partial right paranasal sinus opacification and superimposed acute sinusitis was possible.  CTA of the head and neck recommended by teleneurology revealed no large vessel occlusion hemodynamically significant stenosis or evidence of dissection.  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  The patient was given 325 mg p.o. aspirin and 40 mg of p.o. Lipitor.  He will be admitted to a medical monitored bed for further  evaluation and management. PAST MEDICAL HISTORY:   Past Medical History:  Diagnosis Date  . Anemia   . Clotting disorder ([place])     PAST SURGICAL HISTORY:   Past Surgical History:  Procedure Laterality Date  . APPENDECTOMY    . COLONOSCOPY  2006  . KNEE SURGERY  1986  Right myringotomy tube that fell off recently.  SOCIAL HISTORY:   Social History   Tobacco Use  . Smoking status: Never Smoker  . Smokeless tobacco: Never Used  Substance Use Topics  . Alcohol use: Yes    FAMILY HISTORY:   Family History  Problem Relation Age of Onset  . Cancer Maternal Aunt   . Cancer Paternal Uncle     DRUG ALLERGIES:   Allergies  Allergen Reactions  . Amoxicillin-Pot Clavulanate Diarrhea  . Nitroglycerin Other (See Comments)    Sudden drop in BP    REVIEW OF SYSTEMS:   ROS As per history of present illness. All pertinent systems were reviewed above. Constitutional, HEENT, cardiovascular, respiratory, GI, GU, musculoskeletal, neuro, psychiatric, endocrine, integumentary and hematologic systems were reviewed and are otherwise negative/unremarkable except for positive findings mentioned above in the HPI.   MEDICATIONS AT HOME:   Prior to Admission medications   Medication Sig Start Date End Date Taking? Authorizing Provider  Acetaminophen (TYLENOL ARTHRITIS PAIN PO) Take 650 mg by mouth 3 (three) times daily.   Yes [provider]  cetirizine (ZYRTEC) 10 MG tablet Take 10 mg by mouth at bedtime.   Yes [provider]  Cholecalciferol (VITAMIN D3)  2000 units capsule Take 2,000 Units by mouth daily.   Yes [provider]  ELIQUIS 5 MG TABS tablet SMARTSIG:1 Tablet(s) By Mouth Every 12 Hours 02/16/20  Yes [provider]  Ferrous Sulfate Dried 45 MG TBCR Take 1 tablet by mouth at bedtime.   Yes [provider]  gabapentin (NEURONTIN) 100 MG capsule Take 100 mg by mouth 3 (three) times daily. 07/11/20  Yes [provider]   omeprazole (PRILOSEC) 20 MG capsule Take 20 mg by mouth daily. 03/09/17  Yes [provider]  vitamin B-12 (CYANOCOBALAMIN) 1000 MCG tablet Take 1,000 mcg by mouth daily.   Yes [provider]      VITAL SIGNS:  Blood pressure (!) 161/98, pulse 87, temperature (!) 97.5 F (36.4 C), temperature source Oral, resp. rate 16, height 5\' 6"  (1.676 m), weight 102.6 kg, SpO2 98 %.  PHYSICAL EXAMINATION:  Physical Exam  GENERAL:  69 y.o.-year-old Caucasian male patient lying in the bed with no acute distress.  EYES: Pupils equal, round, reactive to light and accommodation. No scleral icterus. Extraocular muscles intact.  HEENT: Head atraumatic, normocephalic. Oropharynx and nasopharynx clear.  NECK:  Supple, no jugular venous distention. No thyroid enlargement, no tenderness.  LUNGS: Normal breath sounds bilaterally, no wheezing, rales,rhonchi or crepitation. No use of accessory muscles of respiration.  CARDIOVASCULAR: Regular rate and rhythm, S1, S2 normal. No murmurs, rubs, or gallops.  ABDOMEN: Soft, nondistended, nontender. Bowel sounds present. No organomegaly or mass.  EXTREMITIES: No pedal edema, cyanosis, or clubbing.  NEUROLOGIC: Cranial nerves II through XII are intact. Muscle strength 5/5 in all extremities except the right lower extremity where muscle strength was 3-4/5. Sensation intact. Gait not checked.  His speech was almost close to baseline. PSYCHIATRIC: The patient is alert and oriented x 3.  Normal affect and good eye contact. SKIN: No obvious rash, lesion, or ulcer.   LABORATORY PANEL:   CBC Recent Labs  Lab 11/14/20 1756  WBC 10.1  HGB 14.4  HCT 44.0  PLT 186   ------------------------------------------------------------------------------------------------------------------  Chemistries  Recent Labs  Lab 11/14/20 1756  NA 140  K 4.6  CL 107  CO2 25  GLUCOSE 160*  BUN 16  CREATININE 0.95  CALCIUM 8.6*  AST 26  ALT 22  ALKPHOS 35*   BILITOT 0.6   ------------------------------------------------------------------------------------------------------------------  Cardiac Enzymes No results for input(s): TROPONINI in the last 168 hours. ------------------------------------------------------------------------------------------------------------------  RADIOLOGY:  CT Code Stroke CTA Head W/WO contrast  Result Date: 11/14/2020 CLINICAL DATA:  Slurred speech, right arm weakness EXAM: CT ANGIOGRAPHY HEAD AND NECK TECHNIQUE: Multidetector CT imaging of the head and neck was performed using the standard protocol during bolus administration of intravenous contrast. Multiplanar CT image reconstructions and MIPs were obtained to evaluate the vascular anatomy. Carotid stenosis measurements (when applicable) are obtained utilizing NASCET criteria, using the distal internal carotid diameter as the denominator. CONTRAST:  13mL OMNIPAQUE IOHEXOL 350 MG/ML SOLN COMPARISON:  None. FINDINGS: CTA NECK Aortic arch: Mild calcified plaque. Great vessel origins are patent. Right carotid system: Patent. Minimal noncalcified plaque along the common carotid. No measurable stenosis at the ICA origin. Left carotid system: Patent. Minimal noncalcified plaque along the common carotid. Eccentric noncalcified plaque along ICA origin without measurable stenosis. Vertebral arteries: Patent.  Left vertebral artery is dominant. Skeleton: Advanced degenerative changes of the cervical spine. Other neck: No mass or adenopathy. Upper chest: No apical lung mass. Review of the MIP images confirms the above findings CTA HEAD Anterior circulation: Intracranial  internal carotid arteries are patent with calcified plaque but no significant stenosis. Anterior cerebral arteries are patent. Right A1 ACA is dominant. Anterior communicating artery is present. Middle cerebral arteries are patent. Posterior circulation: Intracranial vertebral arteries are patent. Basilar artery is  patent. Major cerebellar branch origins are patent. Posterior cerebral arteries are patent. Venous sinuses: Patent as allowed by contrast bolus timing. Review of the MIP images confirms the above findings IMPRESSION: No large vessel occlusion, hemodynamically significant stenosis, or evidence of dissection. Electronically Signed   By: Macy Mis M.D.   On: 11/14/2020 19:37   CT Code Stroke CTA Neck W/WO contrast  Result Date: 11/14/2020 CLINICAL DATA:  Slurred speech, right arm weakness EXAM: CT ANGIOGRAPHY HEAD AND NECK TECHNIQUE: Multidetector CT imaging of the head and neck was performed using the standard protocol during bolus administration of intravenous contrast. Multiplanar CT image reconstructions and MIPs were obtained to evaluate the vascular anatomy. Carotid stenosis measurements (when applicable) are obtained utilizing NASCET criteria, using the distal internal carotid diameter as the denominator. CONTRAST:  17mL OMNIPAQUE IOHEXOL 350 MG/ML SOLN COMPARISON:  None. FINDINGS: CTA NECK Aortic arch: Mild calcified plaque. Great vessel origins are patent. Right carotid system: Patent. Minimal noncalcified plaque along the common carotid. No measurable stenosis at the ICA origin. Left carotid system: Patent. Minimal noncalcified plaque along the common carotid. Eccentric noncalcified plaque along ICA origin without measurable stenosis. Vertebral arteries: Patent.  Left vertebral artery is dominant. Skeleton: Advanced degenerative changes of the cervical spine. Other neck: No mass or adenopathy. Upper chest: No apical lung mass. Review of the MIP images confirms the above findings CTA HEAD Anterior circulation: Intracranial internal carotid arteries are patent with calcified plaque but no significant stenosis. Anterior cerebral arteries are patent. Right A1 ACA is dominant. Anterior communicating artery is present. Middle cerebral arteries are patent. Posterior circulation: Intracranial vertebral  arteries are patent. Basilar artery is patent. Major cerebellar branch origins are patent. Posterior cerebral arteries are patent. Venous sinuses: Patent as allowed by contrast bolus timing. Review of the MIP images confirms the above findings IMPRESSION: No large vessel occlusion, hemodynamically significant stenosis, or evidence of dissection. Electronically Signed   By: Macy Mis M.D.   On: 11/14/2020 19:37   MR BRAIN WO CONTRAST  Result Date: 11/14/2020 CLINICAL DATA:  Right-sided weakness and slurred speech EXAM: MRI HEAD WITHOUT CONTRAST TECHNIQUE: Multiplanar, multiecho pulse sequences of the brain and surrounding structures were obtained without intravenous contrast. COMPARISON:  None. FINDINGS: Brain: Acute to subacute infarct within the left pons. No acute or chronic hemorrhage. There is multifocal hyperintense T2-weighted signal within the white matter. Parenchymal volume and CSF spaces are normal. The midline structures are normal. Vascular: Major flow voids are preserved. Skull and upper cervical spine: Normal calvarium and skull base. Visualized upper cervical spine and soft tissues are normal. Sinuses/Orbits:Right mastoid effusion. Normal nasopharynx. Moderate right maxillary, ethmoid and frontal sinus disease. Normal orbits. 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 No hemorrhage or mass effect. Electronically Signed   By: Ulyses Jarred M.D.   On: 11/14/2020 20:47   CT HEAD CODE STROKE WO CONTRAST  Result Date: 11/14/2020 CLINICAL DATA:  Code stroke.  Slurred speech, right arm drift EXAM: CT HEAD WITHOUT CONTRAST TECHNIQUE: Contiguous axial images were obtained from the base of the skull through the vertex without intravenous contrast. COMPARISON:  None. FINDINGS: Brain: There is no acute intracranial hemorrhage, mass effect, or edema. Gray-white differentiation is preserved. Ventricles and sulci are normal in size  and configuration. There is no hydrocephalus or extra-axial  collection. Vascular: No hyperdense vessel. There is intracranial atherosclerotic calcification at the skull base. Skull: Unremarkable. Sinuses/Orbits: Opacification of the visualized right maxillary sinus. Near total opacification of right frontal and anterior ethmoid air cells. No acute abnormality of the orbits. Other: Opacification of the inferior right mastoid air cells. ASPECTS ([place] Stroke Program Early CT Score) - Ganglionic level infarction (caudate, lentiform nuclei, internal capsule, insula, M1-M3 cortex): 7 - Supraganglionic infarction (M4-M6 cortex): 3 Total score (0-10 with 10 being normal): 10 IMPRESSION: There is no acute intracranial hemorrhage or evidence of acute infarction. ASPECT score is 10. 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 Superimposed acute sinusitis is possible in the appropriate clinical setting. These results were called by telephone at the time of interpretation on 11/14/2020 at 6:11 pm to provider [facility] , who verbally acknowledged these results. Electronically Signed   By: Macy Mis M.D.   On: 11/14/2020 18:15      IMPRESSION AND PLAN:   1.  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 -The patient was admitted to a medical monitored bed. -We will follow neuro checks every 4 hours for 24 hours. -We will continue statin therapy with Lipitor. -We will check TSH, RPR and sed rate. -Neurology consult will be obtained. -I notified Dr. Curly [place] about patient. -PT/OT and ST consults will be obtained. -The patient will be continued on Eliquis and baby aspirin.  2.  History of trigeminal neuralgia. -We will continue Neurontin.  3.  GERD. -We will continue PPI therapy.  4.  Vitamin B12 deficiency. -We will continue vitamin B12.  5.  DVT prophylaxis. -The patient will be continued on Eliquis.    All the records are reviewed and case  discussed with ED provider. The plan of care was discussed in details with the patient (and family). I answered all questions. The patient agreed to proceed with the above mentioned plan. Further management will depend upon hospital course.   CODE STATUS: Full code  Status is: Inpatient  Remains inpatient appropriate because:Ongoing diagnostic testing needed not appropriate for outpatient work up, Unsafe d/c plan, IV treatments appropriate due to intensity of illness or inability to take PO and Inpatient level of care appropriate due to severity of illness   Dispo: The patient is from: Home              Anticipated d/c is to: Home              Anticipated d/c date is: 2 days              Patient currently is not medically stable to d/c.   TOTAL TIME TAKING CARE OF THIS PATIENT: 55 minutes.    Christel Mormon M.D on 11/14/2020 at 11:11 PM  [facility]   From 7 PM-7 AM, contact night-coverage [email or web address]  CC: Primary care physician; Derinda Late, MD

## 2020-11-15 ENCOUNTER — Observation Stay
Admit: 2020-11-15 | Discharge: 2020-11-15 | Disposition: A | Discharge status: Home / Self Care | Payer: Medicare PPO | Attending: Neurology | Admitting: Neurology

## 2020-11-15 DIAGNOSIS — I639 Cerebral infarction, unspecified: Secondary | ICD-10-CM

## 2020-11-15 DIAGNOSIS — R531 Weakness: Secondary | ICD-10-CM | POA: Diagnosis not present

## 2020-11-15 LAB — LIPID PANEL
Cholesterol: 182 mg/dL (ref 0–200)
HDL: 26 mg/dL — ABNORMAL LOW (ref >40)
LDL Cholesterol: 119 mg/dL — ABNORMAL HIGH (ref 0–99)
Total CHOL/HDL Ratio: 7 RATIO
Triglycerides: 184 mg/dL — ABNORMAL HIGH (ref <150)
VLDL: 37 mg/dL (ref 0–40)

## 2020-11-15 LAB — CBC
HCT: 39.3 % (ref 39.0–52.0)
Hemoglobin: 13.3 g/dL (ref 13.0–17.0)
MCH: 30.2 pg (ref 26.0–34.0)
MCHC: 33.8 g/dL (ref 30.0–36.0)
MCV: 89.1 fL (ref 80.0–100.0)
Platelets: 177 10*3/uL (ref 150–400)
RBC: 4.41 MIL/uL (ref 4.22–5.81)
RDW: 13.3 % (ref 11.5–15.5)
WBC: 10.2 10*3/uL (ref 4.0–10.5)
nRBC: 0 % (ref 0.0–0.2)

## 2020-11-15 LAB — BASIC METABOLIC PANEL
Anion gap: 10 (ref 5–15)
BUN: 15 mg/dL (ref 8–23)
CO2: 24 mmol/L (ref 22–32)
Calcium: 8 mg/dL — ABNORMAL LOW (ref 8.9–10.3)
Chloride: 104 mmol/L (ref 98–111)
Creatinine, Ser: 0.69 mg/dL (ref 0.61–1.24)
GFR, Estimated: >60 mL/min (ref >60)
Glucose, Bld: 114 mg/dL — ABNORMAL HIGH (ref 70–99)
Potassium: 3.4 mmol/L — ABNORMAL LOW (ref 3.5–5.1)
Sodium: 138 mmol/L (ref 135–145)

## 2020-11-15 LAB — RAPID HIV SCREEN (HIV 1/2 AB+AG)
HIV 1/2 Antibodies: NONREACTIVE
HIV-1 P24 Antigen - HIV24: NONREACTIVE

## 2020-11-15 LAB — HEMOGLOBIN A1C
Hgb A1c MFr Bld: 6.1 % — ABNORMAL HIGH (ref 4.8–5.6)
Hgb A1c MFr Bld: 6.4 % — ABNORMAL HIGH (ref 4.8–5.6)
Mean Plasma Glucose: 128.37 mg/dL
Mean Plasma Glucose: 136.98 mg/dL

## 2020-11-15 MED ORDER — AMLODIPINE BESYLATE 5 MG PO TABS
5.0000 mg | ORAL_TABLET | Freq: Every day | ORAL | Status: DC
  Administered 2020-11-15 – 2020-11-16 (×2): 5 mg via ORAL
  Filled 2020-11-15 (×2): qty 1

## 2020-11-15 NOTE — Progress Notes (Addendum)
Neurology Progress Note  Patient ID: Ryan Ryan [name] is a 69 y.o. with PMHx of prediabetes, hypertension, hyperlipidemia  Initially consulted for: Code stroke for right-sided weakness  Subjective: Felt like his speech initially improved nearly to baseline last night but then worsened again.  Felt like his right-sided weakness has been more stable and still improved compared to initial event in the garage.  Exam: Vitals:   11/15/20 0514 11/15/20 0752  BP: (!) 150/86 (!) 151/95  Pulse: 64 73  Resp: 16 18  Temp: 97.7 F (36.5 C) 98.2 F (36.8 C)  SpO2: 98% 98%   Physical Exam  Constitutional: Appears well-developed and well-nourished, central obesity  Psych: Affect appropriate to situation Eyes: No scleral injection HENT: No OP obstruction MSK: no joint deformities.  Cardiovascular: Normal rate and regular rhythm.  Respiratory: Effort normal, non-labored breathing GI: Soft.  No distension. There is no tenderness.  Skin: WDI visible skin  Neuro: Mental Status: Patient is awake, alert, oriented to person, place, month, year, and situation. Patient is Ryan [name] to give a clear and coherent history. No signs of aphasia or neglect Cranial Nerves: II: Visual Fields are full. Pupils are equal, round, and reactive to light.   III,IV, VI: EOMI without ptosis or diplopia, saccadic pursuits  V: Facial sensation is symmetric to temperature VII: Facial movement is symmetric.  VIII: hearing is intact to voice X: Uvula elevates symmetrically XII: tongue is midline without atrophy or fasciculations.  Motor: Tone is normal. Bulk is normal. 5/5 strength was present in all four extremities. Right sided mild pronator drift. Difficulty maintaining right leg antigravity steadily but 5/5 on formal testing  Sensory: Sensation is symmetric to light touch and temperature in the arms and legs. Deep Tendon Reflexes: 3+ and symmetric in the biceps and patellae.  Cerebellar: FNF mildly dysmetric  bilaterally, worse in the right arm, heel to shin intact bilaterally    Pertinent Labs: Lab Results  Component Value Date   CHOL 182 11/15/2020   HDL 26 (L) 11/15/2020   LDLCALC 119 (H) 11/15/2020   TRIG 184 (H) 11/15/2020   CHOLHDL 7.0 11/15/2020    Lab Results  Component Value Date   HGBA1C 6.4 (H) 11/15/2020    Lab Results  Component Value Date   TSH 2.587 11/14/2020   No results found for: ESRSEDRATE, POCTSEDRATE No results found for: CRP No results found for: RPR   HIV negative   Impression: This is a 69 year old gentleman with vascular risk factors as above presenting with a left pontine lacunar infarct.  Etiology is most likely small vessel disease given his risk factors and the location, however echocardiogram should be completed to confirm no central embolic source  Recommendations:  # Left pontine stroke - Stroke labs ESR, CRP, RPR, pending (delay due to equipment malfunction)  - MRI brain completed, personally reviewed, L pontine infarct  - CTA head and neck completed, personally reviewed, no LVO or significant stenosis  - Frequent neuro checks, STAT HCT for any change in neurological status - Echocardiogram pending   - if emergent indication for [facility] such as LV thrombus found, would reconsider earlier [facility] initiation - Prophylactic therapy-Antiplatelet med: Aspirin - dose 313m PO or 3060mPR daily [facility], plan for restart on 11/19/2020 - Atorvastatin 40 mg QHS - Hold plavix given plan to resume AC pending MRI   - Risk factor modification, diet and exercise counseling as well as statin as above  - Telemetry monitoring; 30 day event monitor on discharge  if no arrythmias captured  - Blood pressure goal: Normotension - PT consult, OT consult, Speech consult,   # Complaints of dizziness  > 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

## 2020-11-15 NOTE — Evaluation (Signed)
Physical Therapy Evaluation Patient Details Name: Ryan [name] MRN: [id number] DOB: Sep 22, 1951 Today's Date: 11/15/2020   History of Present Illness  Patient is a 69 y.o.  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  presented to the emergency room with acute onset of expressive dysphasia and right-sided weakness with difficulty ambulating. 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  Clinical Impression  PT evaluation completed. Patient relying heavily on rolling walker for UE support in standing. Patient ambulated 58ft with rolling walker with Min guard assistance for safety with cues for technique using rolling walker. Patient does report RLE "weakness" with ambulation, RLE is 5/5 with manual muscle testing but patient fatigues with activity.  Decreased smooth movements noted in RLE with therapeutic exercises with emphasis on coordination. Patient is previously independent with mobility without assistive device. Recommend PT follow up to maximize independence and address functional limitations. Outpatient PT recommended at this time at discharge.      Follow Up Recommendations Outpatient PT    Equipment Recommendations  Rolling walker with 5" wheels    Recommendations for Other Services       Precautions / Restrictions Precautions Precautions: Fall Restrictions Weight Bearing Restrictions: No      Mobility  Bed Mobility Overal bed mobility: Modified Independent             General bed mobility comments: L rail use    Transfers Overall transfer level: Needs assistance Equipment used: Rolling walker (2 wheeled) Transfers: Sit to/from Stand Sit to Stand: Supervision Stand pivot transfers: Min guard       General transfer comment: supervision for safety. verbal cues for hand placement with standing  Ambulation/Gait Ambulation/Gait assistance: Min guard Gait Distance  (Feet): 70 Feet Assistive device: Rolling walker (2 wheeled) Gait Pattern/deviations: Step-through pattern Gait velocity: decreased   General Gait Details: patient with slow but steady cadence with ambulation. patient does report feeling weakness and decreased coordination with RLE during walking, however no knee buckling noted. patient relying heavily on rolling walker for UE support for balance with ambulation. patient does report feeling mild dizziness with activity and encouraged patient to use rolling walker for safety with mobility. minimal verbal cues for technique using rolling walker  Stairs            Wheelchair Mobility    Modified Rankin (Stroke Patients Only)       Balance Overall balance assessment: Needs assistance Sitting-balance support: No upper extremity supported;Feet supported Sitting balance-Leahy Scale: Good     Standing balance support: During functional activity;No upper extremity supported Standing balance-Leahy Scale: Poor Standing balance comment: unsteady without UE support                             Pertinent Vitals/Pain Pain Assessment: No/denies pain Faces Pain Scale: Hurts a little bit Pain Location: headache Pain Descriptors / Indicators: Headache Pain Intervention(s): Repositioned    Home Living Family/patient expects to be discharged to:: Private residence Living Arrangements: Spouse/significant other;Children Available Help at Discharge: Family;Available 24 hours/day Type of Home: House Home Access: Stairs to enter Entrance Stairs-Rails: Psychiatric nurse of Steps: 4 Home Layout: One level Home Equipment: Shower seat - built in;Grab bars - tub/shower Additional Comments: wife spends ~1/2 day caring for her elderly father, son can be home    Prior Function Level of Independence: Independent  Comments: Drives and Independent I/ADls     Hand Dominance   Dominant Hand: Right     Extremity/Trunk Assessment   Upper Extremity Assessment Upper Extremity Assessment: Defer to OT evaluation RUE Deficits / Details: Strength WFL. RAM intact bilaterally. 5 finger opposition intact c decreased time/speed. FNF intact c increased time. Pinch and palm to finger translation intact c increased time RUE Sensation: decreased light touch RUE Coordination: decreased fine motor    Lower Extremity Assessment Lower Extremity Assessment: RLE deficits/detail;LLE deficits/detail RLE Deficits / Details: 5/5 ankle dorsiflexion/plantarflexion, knee extension, hip flexion, hip add/abd. although patient does report "weakness" with functional activity RLE Sensation: WNL (light touch intact as well as great toe flexion/extension proprioception) RLE Coordination: decreased fine motor LLE Deficits / Details: 5/5 ankle dorsiflexion/plantarflexion, knee extension, hip flexion, hip add/abd LLE Sensation: WNL       Communication   Communication: No difficulties  Cognition Arousal/Alertness: Awake/alert Behavior During Therapy: WFL for tasks assessed/performed Overall Cognitive Status: Within Functional Limits for tasks assessed                                        General Comments      Exercises General Exercises - Lower Extremity Hip ABduction/ADduction: AROM;Strengthening;Right;Supine (x 2 reps) Straight Leg Raises: AROM;Strengthening;10 reps;Supine;Both (with focus on slow, puropuseful movement as patient with decreased smooth movements noted intermittently RLE) Other Exercises Other Exercises: verbal cues for exercise technique Other Exercises: LBD, toileting, hand washing, sup>sit, sit<>stand x3, SPT, ~50 ft mobility, sitting/standing balance/tolerance, self feeding   Assessment/Plan    PT Assessment Patient needs continued PT services  PT Problem List Decreased strength;Decreased activity tolerance;Decreased balance;Decreased mobility;Decreased knowledge of use of  DME;Decreased cognition;Decreased safety awareness;Decreased coordination;Decreased knowledge of precautions       PT Treatment Interventions DME instruction;Gait training;Stair training;Functional mobility training;Therapeutic activities;Therapeutic exercise;Balance training;Neuromuscular re-education    PT Goals (Current goals can be found in the Care Plan section)  Acute Rehab PT Goals Patient Stated Goal: to regain independence PT Goal Formulation: With patient Time For Goal Achievement: 11/29/20 Potential to Achieve Goals: Good    Frequency 7X/week   Barriers to discharge        Co-evaluation               AM-PAC PT "6 Clicks" Mobility  Outcome Measure Help needed turning from your back to your side while in a flat bed without using bedrails?: None Help needed moving from lying on your back to sitting on the side of a flat bed without using bedrails?: None Help needed moving to and from a bed to a chair (including a wheelchair)?: A Little Help needed standing up from a chair using your arms (e.g., wheelchair or bedside chair)?: A Little Help needed to walk in hospital room?: A Little Help needed climbing 3-5 steps with a railing? : A Little 6 Click Score: 20    End of Session Equipment Utilized During Treatment: Gait belt Activity Tolerance: Patient tolerated treatment well Patient left: in bed;with call bell/phone within reach;with bed alarm set;with family/visitor present Nurse Communication: Mobility status (via white board updated with mobility status) PT Visit Diagnosis: Muscle weakness (generalized) (M62.81);Other abnormalities of gait and mobility (R26.89)    Time: 2423-5361 PT Time Calculation (min) (ACUTE ONLY): 39 min   Charges:   PT Evaluation $PT Eval Moderate Complexity: 1 Mod PT Treatments $Gait Training: 8-22 mins $Therapeutic Exercise: 8-22  mins        Minna Merritts, PT, MPT   Percell Locus 11/15/2020, 1:09 PM

## 2020-11-15 NOTE — Evaluation (Addendum)
Clinical/Bedside Swallow Evaluation Patient Details  Name: Ryan [name] MRN: [id number] Date of Birth: 02-Nov-1951  Today's Date: 11/15/2020 Time: SLP Start Time (ACUTE ONLY): 1425 SLP Stop Time (ACUTE ONLY): 1535 SLP Time Calculation (min) (ACUTE ONLY): 70 min  Past Medical History:  Past Medical History:  Diagnosis Date  . Anemia   . Clotting disorder [facility])    Past Surgical History:  Past Surgical History:  Procedure Laterality Date  . APPENDECTOMY    . COLONOSCOPY  2006  . KNEE SURGERY  1986   HPI:  Pt is a 69 y.o. 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  No witnessed seizures.  No urinary or stool incontinence.  He admitted to mild dizziness.  No dyspnea or cough or wheezing or hemoptysis.  No chest pain or palpitations.  No fever or chills.  His speech has been improving the ER as well as his right sided weakness.  He was seen by teleneurology in the ER. 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  PMH: GERD, trigeminal neuralgia, eustachian tube dysfunction w/ tube needing replaced, hypertension not on any medications, metabolic syndrome, idiopathic peripheral neuropathy.   Assessment / Plan / Recommendation Clinical Impression  Pt appears to present w/ mild Motor Speech deficits c/b inconsistent Dysfluency and slight-min Dysarthria of few certain speech sounds. Speech intelligibility was 100%; pt often spoke quickly w/ a close mouth at his Baseline -- discussed the impact of this on speech intelligibility normally. Pt's current speech did not significantly impact his verbal communication at the word-conversation levels but was somewhat noticeable to him at times w/ certain  speech sounds. When he utilized strategies of slowing rate of speech, emphasizing speech sounds/words, and increasing volume, his intelligibility was supported even more and both pt/Wife felt it was "normal". Practiced this w/ pt highlighting need to Slow dow and replenish breath support when needed and also focus on over-articulating his word(open mouth/effort more when talking) -- increased focus on articulation and clarity will increase the exercise/effort of the speech task thus improving recovery. Pt wanted to speak fast w/ less emphasis on words during general conversation. Handouts given to highlight need for increased volume, getting Big, and Slowing down when talking. Discussed the importance of sitting fully upright to support diahpragmatic breathing and breath support for speech. NO expressive or receptive aphasia noted, No Cognitive deficits noted. OM exam appeared wfl for lingual/labial strength/ROM/tone. Discussed handouts w/ pt/Wife; practiced and strategies. Recommended pt f/u w/ his PCP for referral to Outpatient skilled ST servcies if any continued feeling of speech deficits post return home w/ committed practice of the strategies given today to improve articulation. MD, NSG updated. Pt/Wife agreed. SLP Visit Diagnosis: Dysarthria and anarthria (R47.1)    Aspiration Risk       Diet Recommendation          Other  Recommendations     Follow up Recommendations  (TBD - Outpt ST services if needed)      Frequency and Duration  (n/a)          Prognosis        Swallow Study   General HPI: Pt is a 69 y.o. Caucasian male with a known history of unprovoked left lower extremity DVT  on Eliquis, presented to the emergency room with acute onset of dysarthria and right-sided weakness that started around 4:15 PM today with difficulty ambulating then with paresthesias or headache or blurred vision, tinnitus or vertigo.  No witnessed seizures.  No urinary or stool incontinence.  He admitted  to mild dizziness.  No dyspnea or cough or wheezing or hemoptysis.  No chest pain or palpitations.  No fever or chills.  His speech has been improving the ER as well as his right sided weakness.  He was seen by teleneurology in the ER. 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  PMH: GERD, trigeminal neuralgia, eustachian tube dysfunction w/ tube needing replaced, hypertension not on any medications, metabolic syndrome, idiopathic peripheral neuropathy.    Oral/Motor/Sensory Function Overall Oral Motor/Sensory Function: Within functional limits   Ice Chips     Thin Liquid      Nectar Thick     Honey Thick     Puree     Solid             Jerilynn Som, MS, [employer] Speech Language Pathologist Rehab Services [phone] Shantrice Rodenberg 11/15/2020,4:26 PM

## 2020-11-15 NOTE — Progress Notes (Signed)
Echo tech shortage

## 2020-11-15 NOTE — Care Management CC44 (Signed)
Condition Code 44 Documentation Completed  Patient Details  Name: VIREN LEBEAU MRN: [id number] Date of Birth: 1951-10-15   Condition Code 44 given:  Yes Patient signature on Condition Code 44 notice:  Yes Documentation of 2 MD's agreement:  Yes Code 44 added to claim:  Yes    Margarito Liner, LCSW 11/15/2020, 3:36 PM

## 2020-11-15 NOTE — Evaluation (Signed)
Occupational Therapy Evaluation Patient Details Name: Ryan [name] MRN: [id number] DOB: [birth year] Today's Date: 11/15/2020    History of Present Illness Ryan [name]  is a 69 y.o. 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 11/15/20 MRI: Acute to subacute left pontine infarct.   Clinical Impression   Ryan [name] was seen for OT evaluation this date. Prior to hospital admission, Ryan [name] was Independent in I/ADLs and mobility including driving. Ryan [name] lives c wife and son in [facility] [place] c 4 STE and B rail (unable to reach both). Currently Ryan [name] demonstrates impairments in RUE/RLE strength, coordination, and sensation. Ryan [name] is R hand dominant and requires Increased time doff B socks seated EOB. MIN A don R sock - assist for threading over toes 2/2 decreased FMC and sensation of his RLE and RUE. CGA + RW for toilet t/f including clothing mgmt in standing. Increased time setup meal and self-feeding. Ryan [name] would benefit from skilled OT to address noted impairments and functional limitations (see below for any additional details) in order to maximize safety and independence while minimizing falls risk and caregiver burden. Upon hospital discharge, recommend OPOT to maximize Ryan [name] safety and return to functional independence during meaningful occupations of daily life.    Follow Up Recommendations  Outpatient OT;Other (comment) (Supervision for mobility)    Equipment Recommendations  None recommended by OT    Recommendations for Other Services       Precautions / Restrictions Precautions Precautions: Fall Restrictions Weight Bearing Restrictions: No      Mobility Bed Mobility Overal bed mobility: Modified Independent              General bed mobility comments: L rail use    Transfers Overall transfer level: Needs assistance Equipment used: Rolling walker (2 wheeled) Transfers: Sit to/from [employer] Sit to Stand: Min guard Stand pivot transfers: Min guard            Balance Overall balance assessment: Needs assistance Sitting-balance support: No upper extremity supported;Feet supported Sitting balance-Leahy Scale: Good     Standing balance support: During functional activity;No upper extremity supported Standing balance-Leahy Scale: Poor Standing balance comment: requires single UE support                           ADL either performed or assessed with clinical judgement   ADL Overall ADL's : Needs assistance/impaired                                       General ADL Comments: Increased time doff B socks seated EOB. MIN A don R sock - assist for threading over toes. CGA + RW for toilet t/f including clothing mgmt in standing. Increased time setup meal and self-feeding                  Pertinent Vitals/Pain Pain Assessment: Faces Faces Pain Scale: Hurts a little bit Pain Location: headache Pain Descriptors / Indicators: Headache Pain Intervention(s): Repositioned     Hand Dominance Right   Extremity/Trunk Assessment Upper Extremity Assessment Upper Extremity Assessment: RUE deficits/detail RUE Deficits / Details: Strength WFL. RAM intact bilaterally. 5 finger opposition intact  c decreased time/speed. FNF intact c increased time. Pinch and palm to finger translation intact c increased time RUE Sensation: decreased light touch RUE Coordination: decreased fine motor   Lower Extremity Assessment Lower Extremity Assessment: RLE deficits/detail RLE Sensation: decreased proprioception       Communication Communication Communication: No difficulties   Cognition Arousal/Alertness: Awake/alert Behavior During Therapy: WFL for tasks  assessed/performed Overall Cognitive Status: Within Functional Limits for tasks assessed                                        Exercises Exercises: Other exercises Other Exercises Other Exercises: Ryan [name] educated re: OT role, DME recs, d/c recs, falls prevention, ECS, stroke education, neuro re-ed, HEP, home/routines modifications Other Exercises: LBD, toileting, hand washing, sup>sit, sit<>stand x3, SPT, ~50 ft mobility, sitting/standing balance/tolerance, self feeding   Shoulder Instructions      Home Living Family/patient expects to be discharged to:: Private residence Living Arrangements: Spouse/significant other;Children (wife, son) Available Help at Discharge: Family;Available 24 hours/day Type of Home: House Home Access: Stairs to enter [employer] of Steps: 4 Entrance Stairs-Rails: Right;Left Home Layout: One level     Bathroom Shower/Tub: Runner, broadcasting/film/video: Shower seat - built in;Grab bars - tub/shower   Additional Comments: wife spends ~1/2 day caring for her elderly father, son can be home      Prior Functioning/Environment Level of Independence: Independent        Comments: Drives and Independent I/ADls        OT Problem List: Decreased range of motion;Decreased activity tolerance;Impaired balance (sitting and/or standing);Decreased coordination      OT Treatment/Interventions: Self-care/ADL training;Therapeutic exercise;Energy conservation;DME and/or AE instruction;Therapeutic activities;Patient/family education;Balance training    OT Goals(Current goals can be found in the care plan section) Acute Rehab OT Goals Patient Stated Goal: To return to PLOF OT Goal Formulation: With patient Time For Goal Achievement: 11/29/20 Potential to Achieve Goals: Good ADL Goals Ryan [name] Will Perform Grooming: Independently;standing Ryan [name] Will Perform Lower Body Dressing: Independently;sit to/from stand Ryan [name] Will Transfer to Toilet:  Independently;ambulating;regular height toilet Ryan [name]/caregiver will Perform Home Exercise Program: Right Upper extremity;Independently (improved FMC)  OT Frequency: Min 2X/week    AM-PAC OT "6 Clicks" Daily Activity     Outcome Measure Help from another person eating meals?: None Help from another person taking care of personal grooming?: A Little Help from another person toileting, which includes using toliet, bedpan, or urinal?: A Little Help from another person bathing (including washing, rinsing, drying)?: A Little Help from another person to put on and taking off regular upper body clothing?: None Help from another person to put on and taking off regular lower body clothing?: A Little 6 Click Score: 20   End of Session Equipment Utilized During Treatment: Rolling walker Nurse Communication: Mobility status  Activity Tolerance: Patient tolerated treatment well Patient left: in chair;with call bell/phone within reach;with chair alarm set  OT Visit Diagnosis: Other abnormalities of gait and mobility (R26.89);Hemiplegia and hemiparesis Hemiplegia - Right/Left: Right Hemiplegia - dominant/non-dominant: Dominant Hemiplegia - caused by: Cerebral infarction                Time: 4818-5631 OT Time Calculation (min): 41 min Charges:  OT General Charges $OT Visit: 1 Visit OT Evaluation $OT Eval Low Complexity: 1 Low OT Treatments $Self Care/Home Management : 38-52 mins  Kathie Dike,  M.S. OTR/L  11/15/20, 10:03 AM  ascom [phone]

## 2020-11-15 NOTE — TOC Initial Note (Signed)
Transition of Care [facility]) - Initial/Assessment Note    Patient Details  Name: Ryan [name] MRN: [id number] Date of Birth: [birth year]  [facility]) CM/SW Contact:    Candie Chroman, LCSW Phone Number: 11/15/2020, 3:50 PM  Clinical Narrative:  CSW met with patient. Wife at bedside. CSW introduced role and explained that therapy recommendations would be discussed. Patient and his wife agreeable to outpatient therapy. They will review clinic list tonight. Patient agreeable to DME recommendation for a walker. Patient and wife requesting medical explanation as to why he does not meet inpatient criteria. UR nurse will call him. No further concerns. CSW encouraged patient and his wife to contact CSW as needed. CSW will continue to follow patient and his wife for support and facilitate return home when stable.               Expected Discharge Plan: OP Rehab Barriers to Discharge: Continued Medical Work up   Patient Goals and CMS Choice        Expected Discharge Plan and Services Expected Discharge Plan: OP Rehab     Post Acute Care Choice: Durable Medical Equipment (Outpatient therapy) Living arrangements for the past 2 months: Single Family Home                                      Prior Living Arrangements/Services Living arrangements for the past 2 months: Single Family Home Lives with:: Spouse Patient language and need for interpreter reviewed:: Yes Do you feel safe going back to the place where you live?: Yes      Need for Family Participation in Patient Care: Yes (Comment) Care giver support system in place?: Yes (comment)   Criminal Activity/Legal Involvement Pertinent to Current Situation/Hospitalization: No - Comment as needed  Activities of Daily Living      Permission Sought/Granted Permission sought to share information with : Family Supports Permission granted to share information with : Yes, Verbal Permission Granted  Share Information with  NAME: Ryan [name]     Permission granted to share info w Relationship: Wife  Permission granted to share info w Contact Information: [phone]  Emotional Assessment Appearance:: Appears stated age Attitude/Demeanor/Rapport: Engaged,Gracious Affect (typically observed): Accepting,Appropriate,Calm,Pleasant Orientation: : Oriented to Self,Oriented to Place,Oriented to  Time,Oriented to Situation Alcohol / Substance Use: Not Applicable Psych Involvement: No (comment)  Admission diagnosis:  Slurred speech [R47.81] CVA (cerebral vascular accident) ([place]) [I63.9] Right sided weakness [R53.1] Cerebrovascular accident (CVA) due to other mechanism [facility]) [I63.89] Patient Active Problem List   Diagnosis Date Noted  . CVA (cerebral vascular accident) ([place]) 11/14/2020  . History of deep venous thrombosis 02/05/2017  . Allergic rhinitis 08/03/2014  . Esophageal reflux 08/03/2014  . Metabolic syndrome 32/35/5732  . Other and unspecified hyperlipidemia 08/03/2014   PCP:  Derinda Late, MD Pharmacy:   [place], [facility] [address] [place] [address] Phone: [phone] Fax: [phone]  [place], [place] [address] [place] [address] Phone: [phone] Fax: [phone]     Social Determinants of Health (SDOH) Interventions    Readmission Risk Interventions No flowsheet data found.

## 2020-11-15 NOTE — Care Management Obs Status (Signed)
MEDICARE OBSERVATION STATUS NOTIFICATION   Patient Details  Name: Ryan [name] MRN: [id number] Date of Birth: [birth year]   Medicare Observation Status Notification Given:  Yes    Margarito Liner, LCSW 11/15/2020, 3:35 PM

## 2020-11-15 NOTE — Progress Notes (Signed)
[facility] [place] NAME: Ryan [name]    MR#:  [id number]  DATE OF BIRTH:  April 30, 1951  SUBJECTIVE:  CHIEF COMPLAINT:   Chief Complaint  Patient presents with  . Weakness  Speech is 90% better and right-sided weakness is almost resolved as well.  Patient reports feeling 67 to 90% better from yesterday.  Wife at bedside REVIEW OF SYSTEMS:  Review of Systems  Constitutional: Negative for diaphoresis, fever, malaise/fatigue and weight loss.  HENT: Negative for ear discharge, ear pain, hearing loss, nosebleeds, sore throat and tinnitus.   Eyes: Negative for blurred vision and pain.  Respiratory: Negative for cough, hemoptysis, shortness of breath and wheezing.   Cardiovascular: Negative for chest pain, palpitations, orthopnea and leg swelling.  Gastrointestinal: Negative for abdominal pain, blood in stool, constipation, diarrhea, heartburn, nausea and vomiting.  Genitourinary: Negative for dysuria, frequency and urgency.  Musculoskeletal: Negative for back pain and myalgias.  Skin: Negative for itching and rash.  Neurological: Positive for speech change and focal weakness. Negative for dizziness, tingling, tremors, seizures, weakness and headaches.  Psychiatric/Behavioral: Negative for depression. The patient is not nervous/anxious.    DRUG ALLERGIES:   Allergies  Allergen Reactions  . Amoxicillin-Pot Clavulanate Diarrhea  . Nitroglycerin Other (See Comments)    Sudden drop in BP   VITALS:  Blood pressure (!) 151/95, pulse 73, temperature 98.2 F (36.8 C), temperature source Oral, resp. rate 18, height 5\' 6"  (1.676 m), weight 102.6 kg, SpO2 98 %. PHYSICAL EXAMINATION:  Physical Exam HENT:     Head: Normocephalic and atraumatic.  Eyes:     Extraocular Movements: EOM normal.     Conjunctiva/sclera: Conjunctivae normal.     Pupils: Pupils are equal, round, and reactive to light.  Neck:     Thyroid: No thyromegaly.     Trachea: No tracheal  deviation.  Cardiovascular:     Rate and Rhythm: Normal rate and regular rhythm.     Heart sounds: Normal heart sounds.  Pulmonary:     Effort: Pulmonary effort is normal. No respiratory distress.     Breath sounds: Normal breath sounds. No wheezing.  Chest:     Chest wall: No tenderness.  Abdominal:     General: Bowel sounds are normal. There is no distension.     Palpations: Abdomen is soft.     Tenderness: There is no abdominal tenderness.  Musculoskeletal:        General: Normal range of motion.     Cervical back: Normal range of motion and neck supple.  Skin:    General: Skin is warm and dry.     Findings: No rash.  Neurological:     Mental Status: He is alert and oriented to person, place, and time.     Cranial Nerves: No cranial nerve deficit.     Comments: Pronator drift on right    LABORATORY PANEL:  Male CBC Recent Labs  Lab 11/15/20 0451  WBC 10.2  HGB 13.3  HCT 39.3  PLT 177   ------------------------------------------------------------------------------------------------------------------ Chemistries  Recent Labs  Lab 11/14/20 1756 11/15/20 0451  NA 140 138  K 4.6 3.4*  CL 107 104  CO2 25 24  GLUCOSE 160* 114*  BUN 16 15  CREATININE 0.95 0.69  CALCIUM 8.6* 8.0*  AST 26  --   ALT 22  --   ALKPHOS 35*  --   BILITOT 0.6  --    RADIOLOGY:  CT  Code Stroke CTA Head W/WO contrast  Result Date: 11/14/2020 CLINICAL DATA:  Slurred speech, right arm weakness EXAM: CT ANGIOGRAPHY HEAD AND NECK TECHNIQUE: Multidetector CT imaging of the head and neck was performed using the standard protocol during bolus administration of intravenous contrast. Multiplanar CT image reconstructions and MIPs were obtained to evaluate the vascular anatomy. Carotid stenosis measurements (when applicable) are obtained utilizing NASCET criteria, using the distal internal carotid diameter as the denominator. CONTRAST:  72mL OMNIPAQUE IOHEXOL 350 MG/ML SOLN COMPARISON:  None.  FINDINGS: CTA NECK Aortic arch: Mild calcified plaque. Great vessel origins are patent. Right carotid system: Patent. Minimal noncalcified plaque along the common carotid. No measurable stenosis at the ICA origin. Left carotid system: Patent. Minimal noncalcified plaque along the common carotid. Eccentric noncalcified plaque along ICA origin without measurable stenosis. Vertebral arteries: Patent.  Left vertebral artery is dominant. Skeleton: Advanced degenerative changes of the cervical spine. Other neck: No mass or adenopathy. Upper chest: No apical lung mass. Review of the MIP images confirms the above findings CTA HEAD Anterior circulation: Intracranial internal carotid arteries are patent with calcified plaque but no significant stenosis. Anterior cerebral arteries are patent. Right A1 ACA is dominant. Anterior communicating artery is present. Middle cerebral arteries are patent. Posterior circulation: Intracranial vertebral arteries are patent. Basilar artery is patent. Major cerebellar branch origins are patent. Posterior cerebral arteries are patent. Venous sinuses: Patent as allowed by contrast bolus timing. Review of the MIP images confirms the above findings IMPRESSION: No large vessel occlusion, hemodynamically significant stenosis, or evidence of dissection. Electronically Signed   By: Macy Mis M.D.   On: 11/14/2020 19:37   CT Code Stroke CTA Neck W/WO contrast  Result Date: 11/14/2020 CLINICAL DATA:  Slurred speech, right arm weakness EXAM: CT ANGIOGRAPHY HEAD AND NECK TECHNIQUE: Multidetector CT imaging of the head and neck was performed using the standard protocol during bolus administration of intravenous contrast. Multiplanar CT image reconstructions and MIPs were obtained to evaluate the vascular anatomy. Carotid stenosis measurements (when applicable) are obtained utilizing NASCET criteria, using the distal internal carotid diameter as the denominator. CONTRAST:  23mL OMNIPAQUE IOHEXOL  350 MG/ML SOLN COMPARISON:  None. FINDINGS: CTA NECK Aortic arch: Mild calcified plaque. Great vessel origins are patent. Right carotid system: Patent. Minimal noncalcified plaque along the common carotid. No measurable stenosis at the ICA origin. Left carotid system: Patent. Minimal noncalcified plaque along the common carotid. Eccentric noncalcified plaque along ICA origin without measurable stenosis. Vertebral arteries: Patent.  Left vertebral artery is dominant. Skeleton: Advanced degenerative changes of the cervical spine. Other neck: No mass or adenopathy. Upper chest: No apical lung mass. Review of the MIP images confirms the above findings CTA HEAD Anterior circulation: Intracranial internal carotid arteries are patent with calcified plaque but no significant stenosis. Anterior cerebral arteries are patent. Right A1 ACA is dominant. Anterior communicating artery is present. Middle cerebral arteries are patent. Posterior circulation: Intracranial vertebral arteries are patent. Basilar artery is patent. Major cerebellar branch origins are patent. Posterior cerebral arteries are patent. Venous sinuses: Patent as allowed by contrast bolus timing. Review of the MIP images confirms the above findings IMPRESSION: No large vessel occlusion, hemodynamically significant stenosis, or evidence of dissection. Electronically Signed   By: Macy Mis M.D.   On: 11/14/2020 19:37   MR BRAIN WO CONTRAST  Result Date: 11/14/2020 CLINICAL DATA:  Right-sided weakness and slurred speech EXAM: MRI HEAD WITHOUT CONTRAST TECHNIQUE: Multiplanar, multiecho pulse sequences of the brain and surrounding structures were  obtained without intravenous contrast. COMPARISON:  None. FINDINGS: Brain: Acute to subacute infarct within the left pons. No acute or chronic hemorrhage. There is multifocal hyperintense T2-weighted signal within the white matter. Parenchymal volume and CSF spaces are normal. The midline structures are normal.  Vascular: Major flow voids are preserved. Skull and upper cervical spine: Normal calvarium and skull base. Visualized upper cervical spine and soft tissues are normal. Sinuses/Orbits:Right mastoid effusion. Normal nasopharynx. Moderate right maxillary, ethmoid and frontal sinus disease. Normal orbits. 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 No hemorrhage or mass effect. Electronically Signed   By: Deatra Robinson M.D.   On: 11/14/2020 20:47   CT HEAD CODE STROKE WO CONTRAST  Result Date: 11/14/2020 CLINICAL DATA:  Code stroke.  Slurred speech, right arm drift EXAM: CT HEAD WITHOUT CONTRAST TECHNIQUE: Contiguous axial images were obtained from the base of the skull through the vertex without intravenous contrast. COMPARISON:  None. FINDINGS: Brain: There is no acute intracranial hemorrhage, mass effect, or edema. Gray-white differentiation is preserved. Ventricles and sulci are normal in size and configuration. There is no hydrocephalus or extra-axial collection. Vascular: No hyperdense vessel. There is intracranial atherosclerotic calcification at the skull base. Skull: Unremarkable. Sinuses/Orbits: Opacification of the visualized right maxillary sinus. Near total opacification of right frontal and anterior ethmoid air cells. No acute abnormality of the orbits. Other: Opacification of the inferior right mastoid air cells. ASPECTS (Alberta Stroke Program Early CT Score) - Ganglionic level infarction (caudate, lentiform nuclei, internal capsule, insula, M1-M3 cortex): 7 - Supraganglionic infarction (M4-M6 cortex): 3 Total score (0-10 with 10 being normal): 10 IMPRESSION: There is no acute intracranial hemorrhage or evidence of acute infarction. ASPECT score is 10. 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 Superimposed acute sinusitis is possible in the appropriate clinical setting. These results were called by telephone at the time of  interpretation on 11/14/2020 at 6:11 pm to provider [facility] , who verbally acknowledged these results. Electronically Signed   By: Guadlupe Spanish M.D.   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  Acute left pontine lacunar infarct Seen and confirmed on MRI of the brain CTA of the head and neck does not show any LVO or significant stenosis Per neurology hold Eliquis with plan to restart on 11/19/2020 and continue aspirin 325 mg for now till 11/18/2020. Continue atorvastatin 40 mg p.o. nightly Patient's symptoms are about 70% or more improved per patient PT, OT recommends outpatient therapy Will need Holter monitor for 30 days at discharge Unfortunately due to staffing shortage this echo will not be performed till later this evening which will likely require him to stay overnight.  History of trigeminal neuralgia Continue Neurontin  GERD PPI  B12 deficiency Continue supplementation  Obesity Body mass index is 36.51 kg/m.      Status is: Observation  The patient remains OBS appropriate and will d/c before 2 midnights.  Dispo: The patient is from: Home              Anticipated d/c is to: Home              Anticipated d/c date is: 1 day              Patient currently is not medically stable to d/c.  Unfortunately due to staffing shortage of psychotic may not be able to  perform echo till late evening necessitating overnight stay.    DVT prophylaxis:        Lovenox subcu    Family Communication: (Updated wife at bedside)   All the records are reviewed and case discussed with Care Management/Social Worker. Management plans discussed with the patient, family and they are in agreement.  CODE STATUS: Full Code  TOTAL TIME TAKING CARE OF THIS PATIENT: 35 minutes.   More than 50% of the time was spent in counseling/coordination of care: YES  POSSIBLE  D/C IN 1 DAYS, DEPENDING ON CLINICAL CONDITION.   Max Sane M.D on 11/15/2020 at 3:12 PM  [facility]   CC: Primary care physician; Derinda Late, MD  Note: This dictation was prepared with Dragon dictation along with smaller phrase technology. Any transcriptional errors that result from this process are unintentional.

## 2020-11-16 DIAGNOSIS — R531 Weakness: Secondary | ICD-10-CM | POA: Diagnosis not present

## 2020-11-16 DIAGNOSIS — R4781 Slurred speech: Secondary | ICD-10-CM

## 2020-11-16 LAB — HIV ANTIBODY (ROUTINE TESTING W REFLEX): HIV Screen 4th Generation wRfx: NONREACTIVE

## 2020-11-16 LAB — ECHOCARDIOGRAM COMPLETE
Area-P 1/2: 5.16 cm2
Height: 66 in
S' Lateral: 2.64 cm
Weight: 3619.07 oz

## 2020-11-16 LAB — HIGH SENSITIVITY CRP: CRP, High Sensitivity: 4 mg/L — ABNORMAL HIGH (ref 0.00–3.00)

## 2020-11-16 LAB — SEDIMENTATION RATE: Sed Rate: 34 mm/hr — ABNORMAL HIGH (ref 0–16)

## 2020-11-16 MED ORDER — ELIQUIS 5 MG PO TABS: 5.0000 mg | ORAL_TABLET | Freq: Two times a day (BID) | ORAL | 0 refills | Status: AC

## 2020-11-16 MED ORDER — ASPIRIN 325 MG PO TABS: 325.0000 mg | ORAL_TABLET | Freq: Every day | ORAL | Status: DC

## 2020-11-16 MED ORDER — AMLODIPINE BESYLATE 5 MG PO TABS: 5.0000 mg | ORAL_TABLET | Freq: Every day | ORAL | 1 refills | Status: DC

## 2020-11-16 MED ORDER — ATORVASTATIN CALCIUM 40 MG PO TABS: 40.0000 mg | ORAL_TABLET | Freq: Every day | ORAL | 1 refills | Status: AC

## 2020-11-16 NOTE — TOC Progression Note (Signed)
Transition of Care [facility]) - Progression Note    Patient Details  Name: Ryan [name] MRN: [id number] Date of Birth: 1951-11-11  Transition of Care [facility]) CM/SW Contact  Margarito Liner, LCSW Phone Number: 11/16/2020, 8:55 AM  Clinical Narrative: Kerney Elbe representative and ordered walker.    Expected Discharge Plan: OP Rehab Barriers to Discharge: Continued Medical Work up  Expected Discharge Plan and Services Expected Discharge Plan: OP Rehab     Post Acute Care Choice: Durable Medical Equipment (Outpatient therapy) Living arrangements for the past 2 months: Single Family Home                                       Social Determinants of Health (SDOH) Interventions    Readmission Risk Interventions No flowsheet data found.

## 2020-11-16 NOTE — TOC Transition Note (Signed)
Transition of Care [facility]) - CM/SW Discharge Note   Patient Details  Name: Ryan [name] MRN: [id number] Date of Birth: 13-Sep-1951  [facility]) CM/SW Contact:  Margarito Liner, LCSW Phone Number: 11/16/2020, 2:10 PM   Clinical Narrative: Patient has orders to discharge home today. RW has been delivered to room. Patient has not decided on an outpatient therapy clinic yet. He prefers to discuss with his PCP before making a decision. No further concerns. CSW signing off.    Final next level of care: Home/Self Care Barriers to Discharge: Barriers Resolved   Patient Goals and CMS Choice        Discharge Placement                    Patient and family notified of of transfer: 11/16/20  Discharge Plan and Services     Post Acute Care Choice: Durable Medical Equipment (Outpatient therapy)          DME Arranged: Dan Humphreys rolling DME Agency: Other - Comment Loyal Buba) Date DME Agency Contacted: 11/16/20   Representative spoke with at DME Agency: Shaune Leeks            Social Determinants of Health (SDOH) Interventions     Readmission Risk Interventions No flowsheet data found.

## 2020-11-16 NOTE — Discharge Summary (Signed)
Physician Discharge Summary  Ryan [name] [id number] DOB: 1951/08/20 DOA: 11/14/2020  PCP: Derinda Late, MD  Admit date: 11/14/2020 Discharge date: 11/16/2020  Admitted From: Home Disposition: Home  Recommendations for Outpatient Follow-up:  1. Follow up with PCP in 1-2 weeks 2. Please obtain BMP/CBC in one week 3. Patient has been set up with a 30-day event monitor 4. Follow-up referral has been made to neurology   Discharge Condition: Stable CODE STATUS: Full code Diet recommendation: Heart healthy  Brief/Interim Summary: 69 year old male admitted to the hospital with right-sided weakness and expressive aphasia.  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 -This was held on admission he was started on aspirin -It was felt appropriate to restart Eliquis on 11/19/2020 and continue aspirin until then -He was started on high intensity statin -Echocardiogram was found to be unremarkable -He was referred for 30-day event monitor -Seen by physical therapy with recommendations for outpatient PT -Overall symptoms have improved -Outpatient referral to neurology has been made on discharge  Trigeminal neuralgia -Continue Neurontin  GERD -Continue on PPI   Discharge Instructions  Discharge Instructions    Diet - low sodium heart healthy   Complete by: As directed    Increase activity slowly   Complete by: As directed      Allergies as of 11/16/2020      Reactions   Amoxicillin-pot Clavulanate Diarrhea   Nitroglycerin Other (See Comments)   Sudden drop in BP      Medication List    TAKE these medications   amLODipine 5 MG tablet Commonly known as: NORVASC Take 1  tablet (5 mg total) by mouth daily. Start taking on: November 17, 2020   aspirin 325 MG tablet Take 1 tablet (325 mg total) by mouth daily. Until 11/19/2020 Start taking on: November 17, 2020   atorvastatin 40 MG tablet Commonly known as: LIPITOR Take 1 tablet (40 mg total) by mouth at bedtime.   cetirizine 10 MG tablet Commonly known as: ZYRTEC Take 10 mg by mouth at bedtime.   Eliquis 5 MG Tabs tablet Generic drug: apixaban Take 1 tablet (5 mg total) by mouth 2 (two) times daily. Resume on 11/19/2020 What changed: See the new instructions.   Ferrous Sulfate Dried 45 MG Tbcr Take 1 tablet by mouth at bedtime.   gabapentin 100 MG capsule Commonly known as: NEURONTIN Take 100 mg by mouth 3 (three) times daily.   omeprazole 20 MG capsule Commonly known as: PRILOSEC Take 20 mg by mouth daily.   TYLENOL ARTHRITIS PAIN PO Take 650 mg by mouth 3 (three) times daily.   vitamin B-12 1000 MCG tablet Commonly known as: CYANOCOBALAMIN Take 1,000 mcg by mouth daily.   Vitamin D3 50 MCG (2000 UT) capsule Take 2,000 Units by mouth daily.            Durable Medical Equipment  (From admission, onward)         Start     Ordered   11/15/20 1544  For home use only DME Walker rolling  Once       Question Answer Comment  Walker: With 5 Inch Wheels   Patient needs a walker to treat with the following condition Impaired ambulation  11/15/20 1543          Follow-up Information    Derinda Late, MD. Schedule an appointment as soon as possible for a visit in 1 week.   Specialty: Family Medicine Contact information: [address] [facility] [place] [address] [phone]        Teodoro Spray, MD.   Specialty: Cardiology Why: please go to office after discharge to get 30 day heart monitor Contact information: [place] [address] [phone]              Allergies  Allergen Reactions   . Amoxicillin-Pot Clavulanate Diarrhea  . Nitroglycerin Other (See Comments)    Sudden drop in BP    Consultations:  Neurology   Procedures/Studies: CT Code Stroke CTA Head W/WO contrast  Result Date: 11/14/2020 CLINICAL DATA:  Slurred speech, right arm weakness EXAM: CT ANGIOGRAPHY HEAD AND NECK TECHNIQUE: Multidetector CT imaging of the head and neck was performed using the standard protocol during bolus administration of intravenous contrast. Multiplanar CT image reconstructions and MIPs were obtained to evaluate the vascular anatomy. Carotid stenosis measurements (when applicable) are obtained utilizing NASCET criteria, using the distal internal carotid diameter as the denominator. CONTRAST:  1mL OMNIPAQUE IOHEXOL 350 MG/ML SOLN COMPARISON:  None. FINDINGS: CTA NECK Aortic arch: Mild calcified plaque. Great vessel origins are patent. Right carotid system: Patent. Minimal noncalcified plaque along the common carotid. No measurable stenosis at the ICA origin. Left carotid system: Patent. Minimal noncalcified plaque along the common carotid. Eccentric noncalcified plaque along ICA origin without measurable stenosis. Vertebral arteries: Patent.  Left vertebral artery is dominant. Skeleton: Advanced degenerative changes of the cervical spine. Other neck: No mass or adenopathy. Upper chest: No apical lung mass. Review of the MIP images confirms the above findings CTA HEAD Anterior circulation: Intracranial internal carotid arteries are patent with calcified plaque but no significant stenosis. Anterior cerebral arteries are patent. Right A1 ACA is dominant. Anterior communicating artery is present. Middle cerebral arteries are patent. Posterior circulation: Intracranial vertebral arteries are patent. Basilar artery is patent. Major cerebellar branch origins are patent. Posterior cerebral arteries are patent. Venous sinuses: Patent as allowed by contrast bolus timing. Review of the MIP images confirms  the above findings IMPRESSION: No large vessel occlusion, hemodynamically significant stenosis, or evidence of dissection. Electronically Signed   By: Macy Mis M.D.   On: 11/14/2020 19:37   CT Code Stroke CTA Neck W/WO contrast  Result Date: 11/14/2020 CLINICAL DATA:  Slurred speech, right arm weakness EXAM: CT ANGIOGRAPHY HEAD AND NECK TECHNIQUE: Multidetector CT imaging of the head and neck was performed using the standard protocol during bolus administration of intravenous contrast. Multiplanar CT image reconstructions and MIPs were obtained to evaluate the vascular anatomy. Carotid stenosis measurements (when applicable) are obtained utilizing NASCET criteria, using the distal internal carotid diameter as the denominator. CONTRAST:  16mL OMNIPAQUE IOHEXOL 350 MG/ML SOLN COMPARISON:  None. FINDINGS: CTA NECK Aortic arch: Mild calcified plaque. Great vessel origins are patent. Right carotid system: Patent. Minimal noncalcified plaque along the common carotid. No measurable stenosis at the ICA origin. Left carotid system: Patent. Minimal noncalcified plaque along the common carotid. Eccentric noncalcified plaque along ICA origin without measurable stenosis. Vertebral arteries: Patent.  Left vertebral artery is dominant. Skeleton: Advanced degenerative changes of the cervical spine. Other neck: No mass or adenopathy. Upper chest: No apical lung mass. Review of the MIP images confirms the above findings  CTA HEAD Anterior circulation: Intracranial internal carotid arteries are patent with calcified plaque but no significant stenosis. Anterior cerebral arteries are patent. Right A1 ACA is dominant. Anterior communicating artery is present. Middle cerebral arteries are patent. Posterior circulation: Intracranial vertebral arteries are patent. Basilar artery is patent. Major cerebellar branch origins are patent. Posterior cerebral arteries are patent. Venous sinuses: Patent as allowed by contrast bolus timing.  Review of the MIP images confirms the above findings IMPRESSION: No large vessel occlusion, hemodynamically significant stenosis, or evidence of dissection. Electronically Signed   By: Guadlupe Spanish M.D.   On: 11/14/2020 19:37   MR BRAIN WO CONTRAST  Result Date: 11/14/2020 CLINICAL DATA:  Right-sided weakness and slurred speech EXAM: MRI HEAD WITHOUT CONTRAST TECHNIQUE: Multiplanar, multiecho pulse sequences of the brain and surrounding structures were obtained without intravenous contrast. COMPARISON:  None. FINDINGS: Brain: Acute to subacute infarct within the left pons. No acute or chronic hemorrhage. There is multifocal hyperintense T2-weighted signal within the white matter. Parenchymal volume and CSF spaces are normal. The midline structures are normal. Vascular: Major flow voids are preserved. Skull and upper cervical spine: Normal calvarium and skull base. Visualized upper cervical spine and soft tissues are normal. Sinuses/Orbits:Right mastoid effusion. Normal nasopharynx. Moderate right maxillary, ethmoid and frontal sinus disease. Normal orbits. 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 No hemorrhage or mass effect. Electronically Signed   By: Deatra Robinson M.D.   On: 11/14/2020 20:47   ECHOCARDIOGRAM COMPLETE  Result Date: 11/16/2020    ECHOCARDIOGRAM REPORT   Patient Name:   KACETON VIEAU Date of Exam: 11/15/2020 Medical Rec #:  [id number]        Height:       66.0 in Accession #:    [id number]       Weight:       226.2 lb Date of Birth:  11-01-1951         BSA:          2.107 m Patient Age:    69 years         BP:           170/78 mmHg Patient Gender: M                HR:           83 bpm. Exam Location:  [facility] Procedure: 2D Echo Indications:     Stroke 434.91 / I163.9  History:         Patient has no prior history of Echocardiogram examinations.                  Stroke.  Sonographer:     Johnathan Hausen Referring Phys:  [id number] Gordy Councilman Diagnosing Phys: Harold Hedge MD   Sonographer Comments: no obvious cardiac source of emboli IMPRESSIONS  1. Left ventricular ejection fraction, by estimation, is 70 to 75%. The left ventricle has hyperdynamic function. The left ventricle has no regional wall motion abnormalities. There is moderate concentric left ventricular hypertrophy. Left ventricular diastolic parameters are consistent with Grade I diastolic dysfunction (impaired relaxation).  2. Right ventricular systolic function is normal. The right ventricular size is normal.  3. Left atrial size was mildly dilated.  4. The mitral valve is grossly normal. Mild mitral valve regurgitation.  5. The aortic valve was not well visualized. Aortic valve regurgitation is trivial. Conclusion(s)/Recommendation(s): No intracardiac source of embolism detected on this transthoracic study. A transesophageal echocardiogram is recommended to exclude cardiac source of  embolism if clinically indicated. FINDINGS  Left Ventricle: Left ventricular ejection fraction, by estimation, is 70 to 75%. The left ventricle has hyperdynamic function. The left ventricle has no regional wall motion abnormalities. The left ventricular internal cavity size was normal in size. There is moderate concentric left ventricular hypertrophy. Left ventricular diastolic parameters are consistent with Grade I diastolic dysfunction (impaired relaxation). Right Ventricle: The right ventricular size is normal. No increase in right ventricular wall thickness. Right ventricular systolic function is normal. Left Atrium: Left atrial size was mildly dilated. Right Atrium: Right atrial size was normal in size. Pericardium: There is no evidence of pericardial effusion. Mitral Valve: The mitral valve is grossly normal. Mild mitral valve regurgitation. Tricuspid Valve: The tricuspid valve is not well visualized. Tricuspid valve regurgitation is trivial. Aortic Valve: The aortic valve was not well visualized. Aortic valve regurgitation is trivial.  Pulmonic Valve: The pulmonic valve was not well visualized. Pulmonic valve regurgitation is not visualized. Aorta: The aortic root is normal in size and structure. IAS/Shunts: The interatrial septum was not well visualized.  LEFT VENTRICLE PLAX 2D LVIDd:         4.71 cm  Diastology LVIDs:         2.64 cm  LV e' medial:    8.27 cm/s LV PW:         1.14 cm  LV E/e' medial:  11.1 LV IVS:        1.57 cm  LV e' lateral:   11.40 cm/s LVOT diam:     1.90 cm  LV E/e' lateral: 8.0 LVOT Area:     2.84 cm  RIGHT VENTRICLE         IVC TAPSE (M-mode): 2.1 cm  IVC diam: 1.35 cm LEFT ATRIUM             Index       RIGHT ATRIUM           Index LA diam:        4.30 cm 2.04 cm/m  RA Area:     15.60 cm LA Vol (A2C):   68.0 ml 32.27 ml/m RA Volume:   40.00 ml  18.98 ml/m LA Vol (A4C):   52.0 ml 24.68 ml/m LA Biplane Vol: 62.8 ml 29.80 ml/m   AORTA Ao Root diam: 3.20 cm MITRAL VALVE MV Area (PHT): 5.16 cm    SHUNTS MV Decel Time: 147 msec    Systemic Diam: 1.90 cm MV E velocity: 91.70 cm/s MV A velocity: 70.30 cm/s MV E/A ratio:  1.30 Bartholome Bill MD Electronically signed by Bartholome Bill MD Signature Date/Time: 11/16/2020/12:10:47 PM    Final    CT HEAD CODE STROKE WO CONTRAST  Result Date: 11/14/2020 CLINICAL DATA:  Code stroke.  Slurred speech, right arm drift EXAM: CT HEAD WITHOUT CONTRAST TECHNIQUE: Contiguous axial images were obtained from the base of the skull through the vertex without intravenous contrast. COMPARISON:  None. FINDINGS: Brain: There is no acute intracranial hemorrhage, mass effect, or edema. Gray-white differentiation is preserved. Ventricles and sulci are normal in size and configuration. There is no hydrocephalus or extra-axial collection. Vascular: No hyperdense vessel. There is intracranial atherosclerotic calcification at the skull base. Skull: Unremarkable. Sinuses/Orbits: Opacification of the visualized right maxillary sinus. Near total opacification of right frontal and anterior ethmoid air  cells. No acute abnormality of the orbits. Other: Opacification of the inferior right mastoid air cells. ASPECTS [facility] Stroke Program Early CT Score) - Ganglionic level infarction (caudate, lentiform nuclei, internal  capsule, insula, M1-M3 cortex): 7 - Supraganglionic infarction (M4-M6 cortex): 3 Total score (0-10 with 10 being normal): 10 IMPRESSION: There is no acute intracranial hemorrhage or evidence of acute infarction. ASPECT score is 10. 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 Superimposed acute sinusitis is possible in the appropriate clinical setting. These results were called by telephone at the time of interpretation on 11/14/2020 at 6:11 pm to provider [facility] , who verbally acknowledged these results. Electronically Signed   By: Macy Mis M.D.   On: 11/14/2020 18:15       Subjective: He is feeling better.  Feels that speech is improving.  No unilateral weakness or numbness.  Discharge Exam: Vitals:   11/15/20 2352 11/16/20 0524 11/16/20 0737 11/16/20 1202  BP: (!) 153/85 (!) 154/91 (!) 165/91 (!) 157/89  Pulse: 72 70 69 72  Resp: 18 16 18    Temp: 97.7 F (36.5 C) 98.2 F (36.8 C) 98.2 F (36.8 C) 98 F (36.7 C)  TempSrc: Oral Oral Oral Oral  SpO2: 97% 98% 97% 98%  Weight:      Height:        General: Pt is alert, awake, not in acute distress Cardiovascular: RRR, S1/S2 +, no rubs, no gallops Respiratory: CTA bilaterally, no wheezing, no rhonchi Abdominal: Soft, NT, ND, bowel sounds + Extremities: no edema, no cyanosis    The results of significant diagnostics from this hospitalization (including imaging, microbiology, ancillary and laboratory) are listed below for reference.     Microbiology: Recent Results (from the past 240 hour(s))  Resp Panel by RT-PCR (Flu A&B, Covid) Nasopharyngeal Swab     Status: None   Collection Time: 11/14/20  6:13 PM   Specimen: Nasopharyngeal Swab;  Nasopharyngeal(NP) swabs in vial transport medium  Result Value Ref Range Status   SARS Coronavirus 2 by RT PCR NEGATIVE NEGATIVE Final    Comment: (NOTE) SARS-CoV-2 target nucleic acids are NOT DETECTED.  The SARS-CoV-2 RNA is generally detectable in upper respiratory specimens during the acute phase of infection. The lowest concentration of SARS-CoV-2 viral copies this assay can detect is 138 copies/mL. A negative result does not preclude SARS-Cov-2 infection and should not be used as the sole basis for treatment or other patient management decisions. A negative result may occur with  improper specimen collection/handling, submission of specimen other than nasopharyngeal swab, presence of viral mutation(s) within the areas targeted by this assay, and inadequate number of viral copies(<138 copies/mL). A negative result must be combined with clinical observations, patient history, and epidemiological information. The expected result is Negative.  Fact Sheet for Patients:  EntrepreneurPulse.com.au  Fact Sheet for Healthcare Providers:  IncredibleEmployment.be  This test is no t yet approved or cleared by the [place] FDA and  has been authorized for detection and/or diagnosis of SARS-CoV-2 by FDA under an Emergency Use Authorization (EUA). This EUA will remain  in effect (meaning this test can be used) for the duration of the COVID-19 declaration under Section 564(b)(1) of the Act, 21 U.S.C.section 360bbb-3(b)(1), unless the authorization is terminated  or revoked sooner.       Influenza A by PCR NEGATIVE NEGATIVE Final   Influenza B by PCR NEGATIVE NEGATIVE Final    Comment: (NOTE) The Xpert Xpress SARS-CoV-2/FLU/RSV plus assay is intended as an aid in the diagnosis of influenza from Nasopharyngeal swab specimens and should not be used as a sole basis for treatment. Nasal washings and aspirates are unacceptable for Xpert Xpress  SARS-CoV-2/FLU/RSV testing.  Fact Sheet for Patients: EntrepreneurPulse.com.au  Fact Sheet for Healthcare Providers: IncredibleEmployment.be  This test is not yet approved or cleared by the [place] FDA and has been authorized for detection and/or diagnosis of SARS-CoV-2 by FDA under an Emergency Use Authorization (EUA). This EUA will remain in effect (meaning this test can be used) for the duration of the COVID-19 declaration under Section 564(b)(1) of the Act, 21 U.S.C. section 360bbb-3(b)(1), unless the authorization is terminated or revoked.  Performed at [facility], [address] [place], [place] [address]      Labs: BNP (last 3 results) No results for input(s): BNP in the last 8760 hours. Basic Metabolic Panel: Recent Labs  Lab 11/14/20 1756 11/15/20 0451  NA 140 138  K 4.6 3.4*  CL 107 104  CO2 25 24  GLUCOSE 160* 114*  BUN 16 15  CREATININE 0.95 0.69  CALCIUM 8.6* 8.0*   Liver Function Tests: Recent Labs  Lab 11/14/20 1756  AST 26  ALT 22  ALKPHOS 35*  BILITOT 0.6  PROT 7.2  ALBUMIN 3.6   No results for input(s): LIPASE, AMYLASE in the last 168 hours. No results for input(s): AMMONIA in the last 168 hours. CBC: Recent Labs  Lab 11/14/20 1756 11/15/20 0451  WBC 10.1 10.2  NEUTROABS 7.7  --   HGB 14.4 13.3  HCT 44.0 39.3  MCV 91.5 89.1  PLT 186 177   Cardiac Enzymes: No results for input(s): CKTOTAL, CKMB, CKMBINDEX, TROPONINI in the last 168 hours. BNP: Invalid input(s): POCBNP CBG: Recent Labs  Lab 11/14/20 1756  GLUCAP 153*   D-Dimer No results for input(s): DDIMER in the last 72 hours. Hgb A1c Recent Labs    11/14/20 1947 11/15/20 0451  HGBA1C 6.1* 6.4*   Lipid Profile Recent Labs    11/15/20 0451  CHOL 182  HDL 26*  LDLCALC 119*  TRIG 184*  CHOLHDL 7.0   Thyroid function studies Recent Labs    11/14/20 1947  TSH 2.587   Anemia work up No results for  input(s): VITAMINB12, FOLATE, FERRITIN, TIBC, IRON, RETICCTPCT in the last 72 hours. Urinalysis    Component Value Date/Time   COLORURINE YELLOW (A) 11/14/2020 1756   APPEARANCEUR CLEAR (A) 11/14/2020 1756   LABSPEC 1.036 (H) 11/14/2020 1756   PHURINE 5.0 11/14/2020 1756   GLUCOSEU 50 (A) 11/14/2020 1756   HGBUR NEGATIVE 11/14/2020 1756   BILIRUBINUR NEGATIVE 11/14/2020 1756   KETONESUR 5 (A) 11/14/2020 1756   PROTEINUR NEGATIVE 11/14/2020 1756   NITRITE NEGATIVE 11/14/2020 1756   LEUKOCYTESUR NEGATIVE 11/14/2020 1756   Sepsis Labs Invalid input(s): PROCALCITONIN,  WBC,  LACTICIDVEN Microbiology Recent Results (from the past 240 hour(s))  Resp Panel by RT-PCR (Flu A&B, Covid) Nasopharyngeal Swab     Status: None   Collection Time: 11/14/20  6:13 PM   Specimen: Nasopharyngeal Swab; Nasopharyngeal(NP) swabs in vial transport medium  Result Value Ref Range Status   SARS Coronavirus 2 by RT PCR NEGATIVE NEGATIVE Final    Comment: (NOTE) SARS-CoV-2 target nucleic acids are NOT DETECTED.  The SARS-CoV-2 RNA is generally detectable in upper respiratory specimens during the acute phase of infection. The lowest concentration of SARS-CoV-2 viral copies this assay can detect is 138 copies/mL. A negative result does not preclude SARS-Cov-2 infection and should not be used as the sole basis for treatment or other patient management decisions. A negative result may occur with  improper specimen collection/handling, submission of specimen other than nasopharyngeal swab,  presence of viral mutation(s) within the areas targeted by this assay, and inadequate number of viral copies(<138 copies/mL). A negative result must be combined with clinical observations, patient history, and epidemiological information. The expected result is Negative.  Fact Sheet for Patients:  EntrepreneurPulse.com.au  Fact Sheet for Healthcare Providers:   IncredibleEmployment.be  This test is no t yet approved or cleared by the [place] FDA and  has been authorized for detection and/or diagnosis of SARS-CoV-2 by FDA under an Emergency Use Authorization (EUA). This EUA will remain  in effect (meaning this test can be used) for the duration of the COVID-19 declaration under Section 564(b)(1) of the Act, 21 U.S.C.section 360bbb-3(b)(1), unless the authorization is terminated  or revoked sooner.       Influenza A by PCR NEGATIVE NEGATIVE Final   Influenza B by PCR NEGATIVE NEGATIVE Final    Comment: (NOTE) The Xpert Xpress SARS-CoV-2/FLU/RSV plus assay is intended as an aid in the diagnosis of influenza from Nasopharyngeal swab specimens and should not be used as a sole basis for treatment. Nasal washings and aspirates are unacceptable for Xpert Xpress SARS-CoV-2/FLU/RSV testing.  Fact Sheet for Patients: EntrepreneurPulse.com.au  Fact Sheet for Healthcare Providers: IncredibleEmployment.be  This test is not yet approved or cleared by the [place] FDA and has been authorized for detection and/or diagnosis of SARS-CoV-2 by FDA under an Emergency Use Authorization (EUA). This EUA will remain in effect (meaning this test can be used) for the duration of the COVID-19 declaration under Section 564(b)(1) of the Act, 21 U.S.C. section 360bbb-3(b)(1), unless the authorization is terminated or revoked.  Performed at [facility], [address], [place], [place] [address]      Time coordinating discharge: 76mins  SIGNED:   Kathie Dike, MD  [facility] 11/16/2020, 8:16 PM   If 7PM-7AM, please contact night-coverage [email or web address]

## 2020-11-16 NOTE — Progress Notes (Signed)
*  PRELIMINARY RESULTS* Echocardiogram 2D Echocardiogram has been performed.  Ryan [name] 11/16/2020, 6:58 AM 

## 2020-11-16 NOTE — Progress Notes (Addendum)
Physical Therapy Treatment Patient Details Name: Ryan [name] MRN: [id number] DOB: May 01, 1951 Today's Date: 11/16/2020    History of Present Illness Patient is a 69 y.o.  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  presented to the emergency room with acute onset of expressive dysphasia and right-sided weakness with difficulty ambulating. 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    PT Comments    Pt was long sitting in bed upon arriving. He agrees to PT session and is cooperative and motivated throughout. Eager to DC home. Pt was easily able to exit bed, stand and ambulate without difficulty. Does rely on RW for safety in all standing activity. Performed stair training without issues. Pt is safe from PT standpoint to safely DC home with outpatient PT to follow.    Follow Up Recommendations  Outpatient PT     Equipment Recommendations  Other (comment) (pt has personal RW in room)    Recommendations for Other Services       Precautions / Restrictions Precautions Precautions: Fall Restrictions Weight Bearing Restrictions: No    Mobility  Bed Mobility Overal bed mobility: Modified Independent     Transfers Overall transfer level: Modified independent Equipment used: Rolling walker (2 wheeled) Transfers: Sit to/from Stand Sit to Stand: Modified independent (Device/Increase time)         General transfer comment: pt received personal RW  Ambulation/Gait Ambulation/Gait assistance: Supervision Gait Distance (Feet): 300 Feet Assistive device: Rolling walker (2 wheeled) Gait Pattern/deviations: WFL(Within Functional Limits)     General Gait Details: pt demonstrated safe abilityt o ambulate with RW 300+ ft   Stairs Stairs: Yes Stairs assistance: Supervision Stair Management: One rail Right Number of Stairs: 4 General stair comments: safely perform  ascending/descending FOS       Balance Overall balance assessment: Needs assistance Sitting-balance support: No upper extremity supported;Feet supported Sitting balance-Leahy Scale: Good     Standing balance support: During functional activity;No upper extremity supported Standing balance-Leahy Scale: Fair Standing balance comment: need BUE support on RW during dynamic task tomaintain balance       Cognition Arousal/Alertness: Awake/alert Behavior During Therapy: WFL for tasks assessed/performed Overall Cognitive Status: Within Functional Limits for tasks assessed        General Comments: Pt is A and O x 4             Pertinent Vitals/Pain Pain Assessment: No/denies pain Faces Pain Scale: No hurt           PT Goals (current goals can now be found in the care plan section) Acute Rehab PT Goals Patient Stated Goal: to regain independence Progress towards PT goals: Progressing toward goals    Frequency    7X/week      PT Plan Current plan remains appropriate       AM-PAC PT "6 Clicks" Mobility   Outcome Measure  Help needed turning from your back to your side while in a flat bed without using bedrails?: None Help needed moving from lying on your back to sitting on the side of a flat bed without using bedrails?: None Help needed moving to and from a bed to a chair (including a wheelchair)?: A Little Help needed standing up from a chair using your arms (e.g., wheelchair or bedside chair)?: A Little Help needed to walk in hospital room?: A Little Help needed climbing 3-5 steps with a railing? : A Little 6 Click Score:  20    End of Session Equipment Utilized During Treatment: Gait belt Activity Tolerance: Patient tolerated treatment well Patient left: in bed;with call bell/phone within reach;with bed alarm set;with family/visitor present Nurse Communication: Mobility status PT Visit Diagnosis: Muscle weakness (generalized) (M62.81);Other abnormalities of  gait and mobility (R26.89)     Time: 5997-7414 PT Time Calculation (min) (ACUTE ONLY): 24 min  Charges:  $Gait Training: 23-37 mins                     Jetta Lout PTA 11/16/20, 12:52 PM

## 2020-11-17 LAB — RPR: RPR Ser Ql: NONREACTIVE

## 2020-11-24 ENCOUNTER — Ambulatory Visit: Payer: Medicare PPO | Admitting: Dermatology

## 2021-03-22 ENCOUNTER — Other Ambulatory Visit: Payer: Self-pay

## 2021-03-22 ENCOUNTER — Ambulatory Visit: Payer: Medicare PPO | Admitting: Dermatology

## 2021-03-22 DIAGNOSIS — L280 Lichen simplex chronicus: Secondary | ICD-10-CM | POA: Diagnosis not present

## 2021-03-22 DIAGNOSIS — L820 Inflamed seborrheic keratosis: Secondary | ICD-10-CM | POA: Diagnosis not present

## 2021-03-22 DIAGNOSIS — B078 Other viral warts: Secondary | ICD-10-CM | POA: Diagnosis not present

## 2021-03-22 DIAGNOSIS — L578 Other skin changes due to chronic exposure to nonionizing radiation: Secondary | ICD-10-CM | POA: Diagnosis not present

## 2021-03-22 NOTE — Progress Notes (Signed)
Follow-Up Visit   Subjective  Ryan [name] is a 70 y.o. male who presents for the following: Warts (Recheck a wart on the Left thumb, right 5th finger). Pt tried squaric acid in the past he stopped because he got a skin rash when he would use squaric acid on the warts on his fingers. Check spots on his arms and back growing and changing.   The following portions of the chart were reviewed this encounter and updated as appropriate:   Tobacco  Allergies  Meds  Problems  Med Hx  Surg Hx  Fam Hx     Review of Systems:  No other skin or systemic complaints except as noted in HPI or Assessment and Plan.  Objective  Well appearing patient in no apparent distress; mood and affect are within normal limits.  A focused examination was performed including face,neck,fingers,back,arms . Relevant physical exam findings are noted in the Assessment and Plan.  Objective  Left Forearm, right flank, sacral area (3): Erythematous keratotic or waxy stuck-on papule or plaque.   Objective  Right 5th finger at DIP joint x 1,  Left thumb: Verrucous papules -- Discussed viral etiology and contagion.   Objective  left post neck: Flesh growth    Assessment & Plan  Inflamed seborrheic keratosis (3) Left Forearm, right flank, sacral area  Destruction of lesion - Left Forearm, right flank, sacral area Complexity: simple   Destruction method: cryotherapy   Informed consent: discussed and consent obtained   Timeout:  patient name, date of birth, surgical site, and procedure verified Lesion destroyed using liquid nitrogen: Yes   Region frozen until ice ball extended beyond lesion: Yes   Outcome: patient tolerated procedure well with no complications   Post-procedure details: wound care instructions given    Other viral warts Right 5th finger at DIP joint x 1,  Left thumb  Discussed viral etiology and risk of spread.  Discussed multiple treatments may be required to clear warts.  Discussed  possible post-treatment dyspigmentation and risk of recurrence.   LN2 x 1 at the Right 5th finger at DIP joint   Start skin medicinals wart paste to wart on the right 5th finger at DIP joint and Left thumb   Fluorouracil: 5% Salicylic Acid: 41% Vehicle: Paste  Recheck area at Left thumb at next office visit  Destruction of lesion - Right 5th finger at DIP joint x 1,  Left thumb Complexity: simple   Destruction method: cryotherapy   Informed consent: discussed and consent obtained   Timeout:  patient name, date of birth, surgical site, and procedure verified Lesion destroyed using liquid nitrogen: Yes   Region frozen until ice ball extended beyond lesion: Yes   Outcome: patient tolerated procedure well with no complications   Post-procedure details: wound care instructions given    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  LN2 x 1  May consider injections at the next office visit   Destruction of lesion - left post neck Complexity: simple   Destruction method: cryotherapy   Informed consent: discussed and consent obtained   Timeout:  patient name, date of birth, surgical site, and procedure verified Lesion destroyed using liquid nitrogen: Yes   Region frozen until ice ball extended beyond lesion: Yes   Outcome: patient tolerated procedure well with no complications   Post-procedure details: wound care instructions given    Actinic Damage - chronic, secondary to cumulative UV radiation exposure/sun exposure over time - diffuse scaly erythematous  macules with underlying dyspigmentation - Recommend daily broad spectrum sunscreen SPF 30+ to sun-exposed areas, reapply every 2 hours as needed.  - Recommend staying in the shade or wearing long sleeves, sun glasses (UVA+UVB protection) and wide brim hats (4-inch brim around the entire circumference of the hat). - Call for new or changing lesions.  Return in about 6 months (around 09/22/2021) for warts,  ISK  .  I, Marye Round, CMA, am acting as scribe for [name], MD .  Documentation: I have reviewed the above documentation for accuracy and completeness, and I agree with the above.  [name], MD

## 2021-03-22 NOTE — Patient Instructions (Addendum)
Cryotherapy Aftercare  . Wash gently with soap and water everyday.   Marland Kitchen Apply Vaseline and Band-Aid daily until healed.     Instructions for [facility] Medications Wart medication  One or more of your medications was sent to the [facility] mail order compounding pharmacy. You will receive an email from them and can purchase the medicine through that link. It will then be mailed to your home at the address you confirmed. If for any reason you do not receive an email from them, please check your spam folder. If you still do not find the email, please let us know. [facility] phone number is [phone].

## 2021-03-23 IMAGING — CT CT ANGIO NECK
2 of 7 series · 8 of 33 positions shown · IV contrast (APPLIED)
Comparison: None.

CLINICAL DATA: Slurred speech, right arm weakness

EXAM:
CT ANGIOGRAPHY HEAD AND NECK
TECHNIQUE: Multidetector CT imaging of the head and neck was performed using
the standard protocol during bolus administration of intravenous
contrast. Multiplanar CT image reconstructions and MIPs were
obtained to evaluate the vascular anatomy. Carotid stenosis
measurements (when applicable) are obtained utilizing NASCET
criteria, using the distal internal carotid diameter as the
denominator.
CONTRAST:  75mL OMNIPAQUE IOHEXOL 350 MG/ML SOLN

[Series 4: cta head neck · axial · 0.45mm/px · z∈[-262,-144]mm · 2 of 179 slices shown]
[im 60/179  soft-tissue]
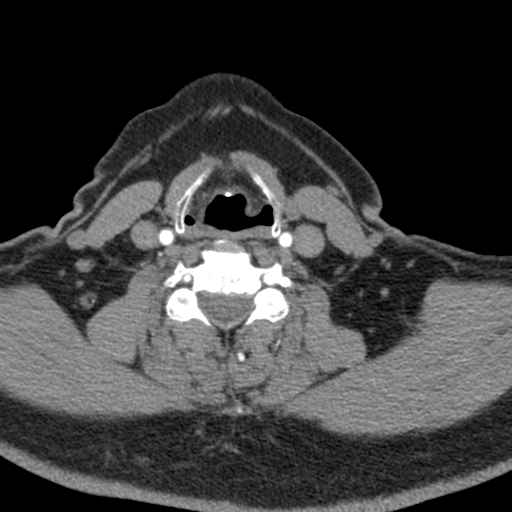
[im 119/179  soft-tissue]
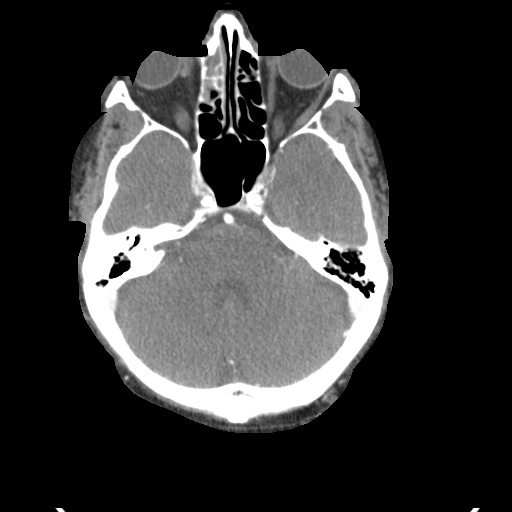

[Series 6: ax thin · axial · 0.54mm/px · z∈[-330,-76]mm · 6 of 356 slices shown]
[im 51/356  soft-tissue]
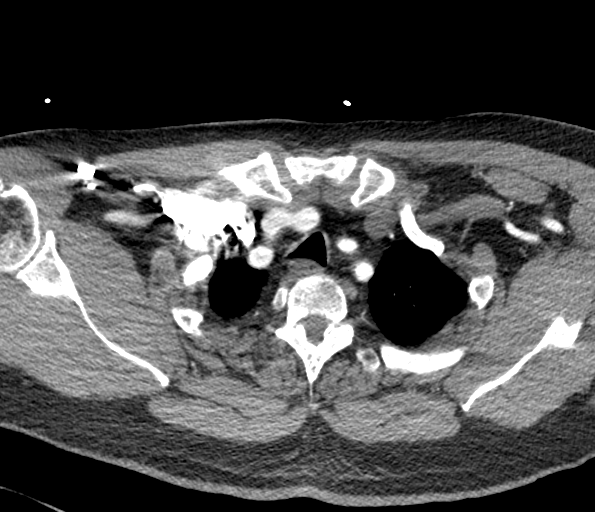
[im 102/356  bone]
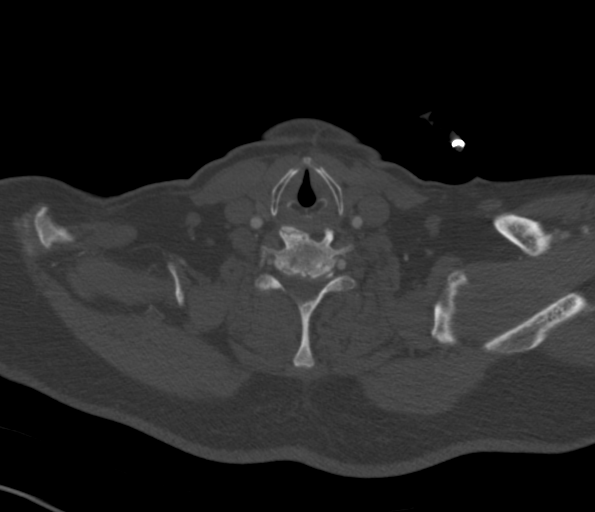
[im 153/356  soft-tissue]
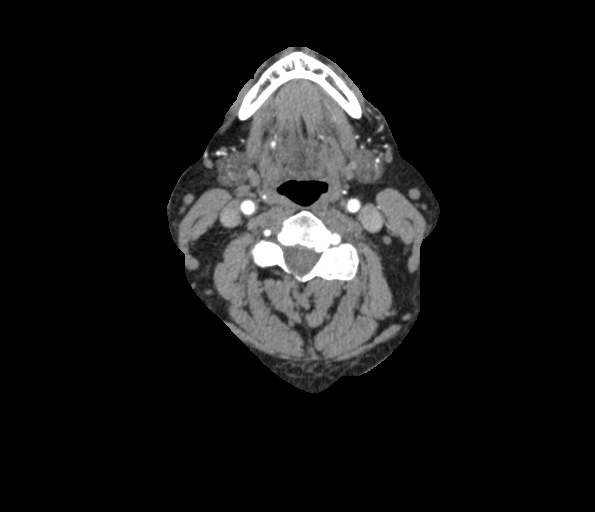
[im 203/356  bone]
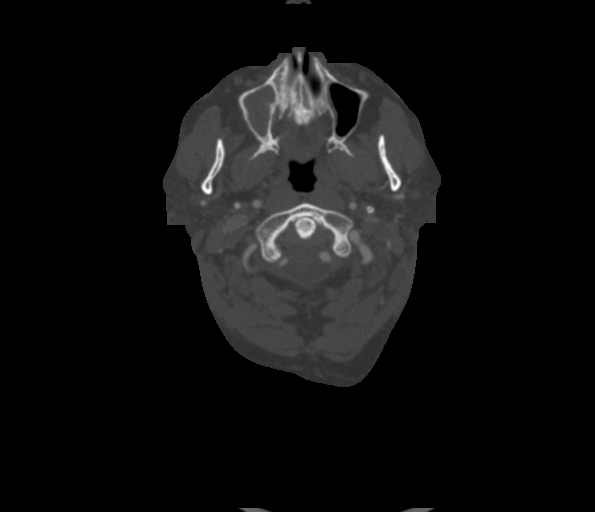
[im 254/356  soft-tissue]
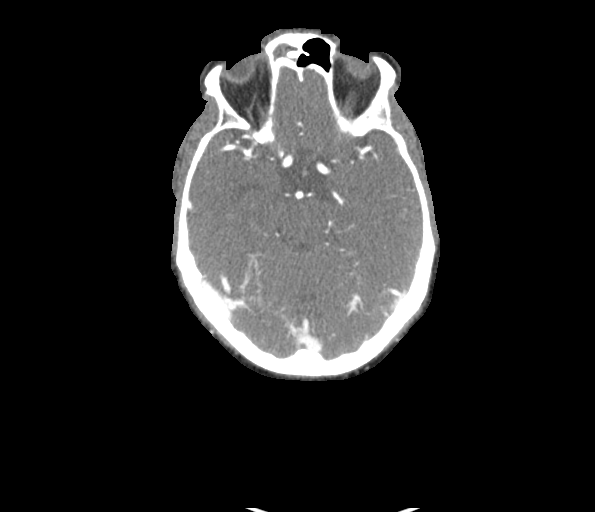
[im 305/356  bone]
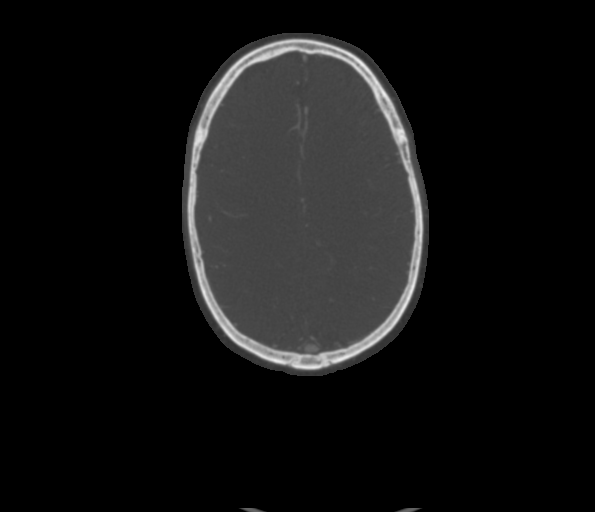

[8 of 33 positions shown; findings below may reference images not displayed]

FINDINGS: CTA NECK

Aortic arch: Mild calcified plaque. Great vessel origins are patent.

Right carotid system: Patent. Minimal noncalcified plaque along the
common carotid. No measurable stenosis at the ICA origin.

Left carotid system: Patent. Minimal noncalcified plaque along the
common carotid. Eccentric noncalcified plaque along ICA origin
without measurable stenosis.

Vertebral arteries: Patent.  Left vertebral artery is dominant.

Skeleton: Advanced degenerative changes of the cervical spine.

Other neck: No mass or adenopathy.

Upper chest: No apical lung mass.

Review of the MIP images confirms the above findings

CTA HEAD

Anterior circulation: Intracranial internal carotid arteries are
patent with calcified plaque but no significant stenosis. Anterior
cerebral arteries are patent. Right A1 ACA is dominant. Anterior
communicating artery is present. Middle cerebral arteries are
patent.

Posterior circulation: Intracranial vertebral arteries are patent.
Basilar artery is patent. Major cerebellar branch origins are
patent. Posterior cerebral arteries are patent.

Venous sinuses: Patent as allowed by contrast bolus timing.

Review of the MIP images confirms the above findings
IMPRESSION: No large vessel occlusion, hemodynamically significant stenosis, or
evidence of dissection.

## 2021-03-24 ENCOUNTER — Encounter: Payer: Self-pay | Admitting: Dermatology

## 2021-10-04 ENCOUNTER — Other Ambulatory Visit: Payer: Self-pay

## 2021-10-04 ENCOUNTER — Ambulatory Visit: Payer: Medicare PPO | Admitting: Dermatology

## 2021-10-04 DIAGNOSIS — B078 Other viral warts: Secondary | ICD-10-CM | POA: Diagnosis not present

## 2021-10-04 DIAGNOSIS — L820 Inflamed seborrheic keratosis: Secondary | ICD-10-CM | POA: Diagnosis not present

## 2021-10-04 NOTE — Patient Instructions (Addendum)

## 2021-10-04 NOTE — Progress Notes (Signed)
   Follow-Up Visit   Subjective  Ryan [name] is a 70 y.o. male who presents for the following: Follow-up (ISK follow up treated with LN2) and Warts (Right 5th finger and left thumb - LN2 to right 5th finger and wart paste from [facility] to left thumb).  The following portions of the chart were reviewed this encounter and updated as appropriate:   Tobacco  Allergies  Meds  Problems  Med Hx  Surg Hx  Fam Hx     Review of Systems:  No other skin or systemic complaints except as noted in HPI or Assessment and Plan.  Objective  Well appearing patient in no apparent distress; mood and affect are within normal limits.  A focused examination was performed including arms, hands, right ear, back. Relevant physical exam findings are noted in the Assessment and Plan.  Right 5th finger DIP joint x 1, left thumb x 1 (2) Verrucous papules -- Discussed viral etiology and contagion.   Left Forearm x 1, right ear x 2, right lat nose x 1 (4) Erythematous keratotic or waxy stuck-on papule or plaque.    Assessment & Plan  Other viral warts (2) Right 5th finger DIP joint x 1, left thumb x 1  Discussed Candida antigen injection.  Destruction of lesion - Right 5th finger DIP joint x 1, left thumb x 1 Complexity: simple   Destruction method: cryotherapy   Informed consent: discussed and consent obtained   Timeout:  patient name, date of birth, surgical site, and procedure verified Lesion destroyed using liquid nitrogen: Yes   Region frozen until ice ball extended beyond lesion: Yes   Outcome: patient tolerated procedure well with no complications   Post-procedure details: wound care instructions given    Intralesional injection - Right 5th finger DIP joint x 1, left thumb x 1 Location: right 5th finger  Informed Consent: Discussed risks (infection, pain, bleeding, bruising, thinning of the skin, loss of skin pigment, lack of resolution, and recurrence of lesion) and benefits of the  procedure, as well as the alternatives. Informed consent was obtained. Preparation: The area was prepared a standard fashion.  Anesthesia:none  Procedure Details: An intralesional injection was performed with candida antigen. 0.1 cc in total were injected.  Total number of injections: 1  Plan: The patient was instructed on post-op care. Recommend OTC analgesia as needed for pain.   Inflamed seborrheic keratosis Left Forearm x 1, right ear x 2, right lat nose x 1  Residual of left forearm  Destruction of lesion - Left Forearm x 1, right ear x 2, right lat nose x 1 Complexity: simple   Destruction method: cryotherapy   Informed consent: discussed and consent obtained   Timeout:  patient name, date of birth, surgical site, and procedure verified Lesion destroyed using liquid nitrogen: Yes   Region frozen until ice ball extended beyond lesion: Yes   Outcome: patient tolerated procedure well with no complications   Post-procedure details: wound care instructions given    Return for 2-8 week wart follow up.  I, Ashok Cordia, CMA, am acting as scribe for [name], MD . Documentation: I have reviewed the above documentation for accuracy and completeness, and I agree with the above.  [name], MD

## 2021-10-17 ENCOUNTER — Encounter: Payer: Self-pay | Admitting: Dermatology

## 2021-11-22 ENCOUNTER — Ambulatory Visit: Payer: Medicare PPO | Admitting: Dermatology

## 2021-11-22 ENCOUNTER — Other Ambulatory Visit: Payer: Self-pay

## 2021-11-22 DIAGNOSIS — B078 Other viral warts: Secondary | ICD-10-CM

## 2021-11-22 NOTE — Patient Instructions (Addendum)
Instructions for After In-Office Application of Cantharidin  1. This is a strong medicine; please follow ALL instructions.  2. Gently wash off with soap and water in four hours or sooner s directed by your physician.  3. **WARNING** this medicine can cause severe blistering, blood blisters, infection, and/or scarring if it is not washed off as directed.  4. Your progress will be rechecked in 1-2 months; call sooner if there are any questions or problems.      If You Need Anything After Your Visit  If you have any questions or concerns for your doctor, please call our main line at [phone] and press option 4 to reach your doctor's medical assistant. If no one answers, please leave a voicemail as directed and we will return your call as soon as possible. Messages left after 4 pm will be answered the following business day.   You may also send [place] a message via [place]. We typically respond to MyChart messages within 1-2 business days.  Cryotherapy Aftercare  Wash gently with soap and water everyday.   Apply Vaseline and Band-Aid daily until healed.     For prescription refills, please ask your pharmacy to contact our office. Our fax number is [phone].  If you have an urgent issue when the clinic is closed that cannot wait until the next business day, you can page your doctor at the number below.    Please note that while we do our best to be available for urgent issues outside of office hours, we are not available 24/7.   If you have an urgent issue and are unable to reach [place], you may choose to seek medical care at your doctor's office, retail clinic, urgent care center, or emergency room.  If you have a medical emergency, please immediately call 911 or go to the emergency department.  Pager Numbers  - Dr. Nehemiah Massed: [phone]  - Dr. Laurence Ferrari: [phone]  - Dr. Nicole Kindred: [phone]  In the event of inclement weather, please call our main line at [phone] for an  update on the status of any delays or closures.  Dermatology Medication Tips: Please keep the boxes that topical medications come in in order to help keep track of the instructions about where and how to use these. Pharmacies typically print the medication instructions only on the boxes and not directly on the medication tubes.   If your medication is too expensive, please contact our office at [phone] option 4 or send [place] a message through [place].   We are unable to tell what your co-pay for medications will be in advance as this is different depending on your insurance coverage. However, we may be able to find a substitute medication at lower cost or fill out paperwork to get insurance to cover a needed medication.   If a prior authorization is required to get your medication covered by your insurance company, please allow [place] 1-2 business days to complete this process.  Drug prices often vary depending on where the prescription is filled and some pharmacies may offer cheaper prices.  The website www.goodrx.com contains coupons for medications through different pharmacies. The prices here do not account for what the cost may be with help from insurance (it may be cheaper with your insurance), but the website can give you the price if you did not use any insurance.  - You can print the associated coupon and take it with your prescription to the pharmacy.  - You may also stop by our office  during regular business hours and pick up a GoodRx coupon card.  - If you need your prescription sent electronically to a different pharmacy, notify our office through [facility] or by phone at [phone] option 4.     Si Usted Necesita Algo Despus de Su Visita  Tambin puede enviarnos un mensaje a travs de Pharmacist, community. Por lo general respondemos a los mensajes de MyChart en el transcurso de 1 a 2 das hbiles.  Para renovar recetas, por favor pida a su farmacia que se ponga en contacto con  nuestra oficina. Harland Dingwall de fax es [place] [phone].  Si tiene un asunto urgente cuando la clnica est cerrada y que no puede esperar hasta el siguiente da hbil, puede llamar/localizar a su doctor(a) al nmero que aparece a continuacin.   Por favor, tenga en cuenta que aunque hacemos todo lo posible para estar disponibles para asuntos urgentes fuera del horario de [place], no estamos disponibles las 24 horas del da, los 7 das de la [place].   Si tiene un problema urgente y no puede comunicarse con nosotros, puede optar por buscar atencin mdica  en el consultorio de su doctor(a), en una clnica privada, en un centro de atencin urgente o en una sala de emergencias.  Si tiene Engineering geologist, por favor llame inmediatamente al 911 o vaya a la sala de emergencias.  Nmeros de bper  - Dr. Nehemiah Massed: [phone]  - Dra. Moye: [phone]  - Dra. Nicole Kindred: [phone]  En caso de inclemencias del [place], por favor llame a Johnsie Kindred principal al [phone] para una actualizacin sobre el [place] de cualquier retraso o cierre.  Consejos para la medicacin en dermatologa: Por favor, guarde las cajas en las que vienen los medicamentos de uso tpico para ayudarle a seguir las instrucciones sobre dnde y cmo usarlos. Las farmacias generalmente imprimen las instrucciones del medicamento slo en las cajas y no directamente en los tubos del [place].   Si su medicamento es muy caro, por favor, pngase en contacto con Zigmund Daniel llamando al [phone] y presione la opcin 4 o envenos un mensaje a travs de Pharmacist, community.   No podemos decirle cul ser su copago por los medicamentos por adelantado ya que esto es diferente dependiendo de la cobertura de su seguro. Sin embargo, es posible que podamos encontrar un medicamento sustituto a Electrical engineer un formulario para que el seguro cubra el medicamento que se considera necesario.   Si se requiere una autorizacin  previa para que su compaa de seguros [place] su medicamento, por favor permtanos de 1 a 2 das hbiles para completar este proceso.  Los precios de los medicamentos varan con frecuencia dependiendo del Environmental consultant de dnde se surte la receta y alguna farmacias pueden ofrecer precios ms baratos.  El sitio web www.goodrx.com tiene cupones para medicamentos de Airline pilot. Los precios aqu no tienen en cuenta lo que podra costar con la ayuda del seguro (puede ser ms barato con su seguro), pero el sitio web puede darle el precio si no utiliz Research scientist (physical sciences).  - Puede imprimir el cupn correspondiente y llevarlo con su receta a la farmacia.  - Tambin puede pasar por nuestra oficina durante el horario de atencin regular y Charity fundraiser una tarjeta de cupones de GoodRx.  - Si necesita que su receta se enve electrnicamente a una farmacia diferente, informe a nuestra oficina a travs de MyChart de [place] o por telfono llamando al [phone] y presione la opcin 4.

## 2021-11-22 NOTE — Progress Notes (Signed)
° °  Follow-Up Visit   Subjective  Ryan [name] is a 71 y.o. male who presents for the following: Warts (6 weeks f/u on warts on fingers treating with [facility] wart past, treated with LN2 and Candida antigen 6 weeks ago with a fair response ).  The following portions of the chart were reviewed this encounter and updated as appropriate:   Tobacco   Allergies   Meds   Problems   Med Hx   Surg Hx   Fam Hx      Review of Systems:  No other skin or systemic complaints except as noted in HPI or Assessment and Plan.  Objective  Well appearing patient in no apparent distress; mood and affect are within normal limits.  A focused examination was performed including fingers . Relevant physical exam findings are noted in the Assessment and Plan.  Right 5th finger DIP joint x 1, left thumb x 1 Verrucous papules -- Discussed viral etiology and contagion.    Assessment & Plan  Other viral warts Right 5th finger DIP joint x 1, left thumb x 1  Discussed viral etiology and risk of spread.  Discussed multiple treatments may be required to clear warts.  Discussed possible post-treatment dyspigmentation and risk of recurrence.   LN2 x 2 Candida Antigen injected to 2 spots   Cont [facility] wart paste   Destruction of lesion - Right 5th finger DIP joint x 1, left thumb x 1 Complexity: simple   Destruction method: cryotherapy   Informed consent: discussed and consent obtained   Timeout:  patient name, date of birth, surgical site, and procedure verified Lesion destroyed using liquid nitrogen: Yes   Region frozen until ice ball extended beyond lesion: Yes   Outcome: patient tolerated procedure well with no complications   Post-procedure details: wound care instructions given    Intralesional injection - Right 5th finger DIP joint x 1, left thumb x 1 Location: Right 5th finger DIP joint, left thumb   Informed Consent: Discussed risks (infection, pain, bleeding, bruising, thinning of  the skin, loss of skin pigment, lack of resolution, and recurrence of lesion) and benefits of the procedure, as well as the alternatives. Informed consent was obtained. Preparation: The area was prepared a standard fashion.  Procedure Details: An intralesional injection was performed with candida antigen. 0.1 cc in total were injected.  Total number of injections: 2  Plan: The patient was instructed on post-op care. Recommend OTC analgesia as needed for pain.  Return in about 6 weeks (around 01/03/2022) for warts and TBSE.  IMarye Round, CMA, am acting as scribe for [name], MD .  Documentation: I have reviewed the above documentation for accuracy and completeness, and I agree with the above.  [name], MD

## 2021-11-23 ENCOUNTER — Encounter: Payer: Self-pay | Admitting: Dermatology

## 2022-01-03 ENCOUNTER — Other Ambulatory Visit: Payer: Self-pay

## 2022-01-03 ENCOUNTER — Ambulatory Visit: Payer: Medicare PPO | Admitting: Dermatology

## 2022-01-03 DIAGNOSIS — B079 Viral wart, unspecified: Secondary | ICD-10-CM | POA: Diagnosis not present

## 2022-01-03 NOTE — Patient Instructions (Addendum)

## 2022-01-03 NOTE — Progress Notes (Signed)
° °  Follow-Up Visit   Subjective  Ryan [name] is a 71 y.o. male who presents for the following: Warts (L thumb, r 5th finger DIP joint, 6 wk f/u, Skin Medicinals wart past 3x/wk, Candida injections x 2, LN2).  The following portions of the chart were reviewed this encounter and updated as appropriate:   Tobacco   Allergies   Meds   Problems   Med Hx   Surg Hx   Fam Hx      Review of Systems:  No other skin or systemic complaints except as noted in HPI or Assessment and Plan.  Objective  Well appearing patient in no apparent distress; mood and affect are within normal limits.  A focused examination was performed including bil hands/fingers. Relevant physical exam findings are noted in the Assessment and Plan.  L thumb x 1, R 5th finger DIP x 1 (2) Verrucous papules -- Discussed viral etiology and contagion.    Assessment & Plan  Viral warts, unspecified type (2) L thumb x 1, R 5th finger DIP x 1  Discussed viral etiology and risk of spread.  Discussed multiple treatments may be required to clear warts.  Discussed possible post-treatment dyspigmentation and risk of recurrence.  Improving  Candida Antigen injection #3 LN2 x 2 Cont Skin Medicinals Wart past ~3x/wk  Destruction of lesion - L thumb x 1, R 5th finger DIP x 1 Complexity: simple   Destruction method: cryotherapy   Informed consent: discussed and consent obtained   Timeout:  patient name, date of birth, surgical site, and procedure verified Lesion destroyed using liquid nitrogen: Yes   Region frozen until ice ball extended beyond lesion: Yes   Outcome: patient tolerated procedure well with no complications   Post-procedure details: wound care instructions given    Intralesional injection - L thumb x 1, R 5th finger DIP x 1 Location: L thumb, R 5th finger DIP joint  Informed Consent: Discussed risks (infection, pain, bleeding, bruising, thinning of the skin, loss of skin pigment, lack of resolution, and  recurrence of lesion) and benefits of the procedure, as well as the alternatives. Informed consent was obtained. Preparation: The area was prepared a standard fashion.  Procedure Details: An intralesional injection was performed with candida antigen. 0.1 cc in total were injected.  Total number of injections: 2  Plan: The patient was instructed on post-op care. Recommend OTC analgesia as needed for pain.  Lot #[id number] exp 07/11/22   Return in about 6 weeks (around 02/14/2022) for wart f/u.  I, [name], RMA, am acting as scribe for [name], MD . Documentation: I have reviewed the above documentation for accuracy and completeness, and I agree with the above.  [name], MD

## 2022-01-06 ENCOUNTER — Encounter: Payer: Self-pay | Admitting: Dermatology

## 2022-02-14 ENCOUNTER — Ambulatory Visit: Payer: Medicare PPO | Admitting: Dermatology

## 2022-02-14 ENCOUNTER — Other Ambulatory Visit: Payer: Self-pay

## 2022-02-14 DIAGNOSIS — B079 Viral wart, unspecified: Secondary | ICD-10-CM | POA: Diagnosis not present

## 2022-02-14 NOTE — Progress Notes (Signed)
? ?  Follow-Up Visit ?  ?Subjective  ?Ryan [name] is a 71 y.o. male who presents for the following: Warts (L thumb, R 5th finger DIP, pt feels the R 5th finger resolved 6wk f/u, Candida antigen injections x 3, LN2, SM wart past 3x/wk). ? ?The following portions of the chart were reviewed this encounter and updated as appropriate:  ? Tobacco  Allergies  Meds  Problems  Med Hx  Surg Hx  Fam Hx   ?  ?Review of Systems:  No other skin or systemic complaints except as noted in HPI or Assessment and Plan. ? ?Objective  ?Well appearing patient in no apparent distress; mood and affect are within normal limits. ? ?A focused examination was performed including L thumb. Relevant physical exam findings are noted in the Assessment and Plan. ? ?L thumb ?Verrucous papule -- Discussed viral etiology and contagion.  ? ? ?Assessment & Plan  ?Viral warts, unspecified type ?L thumb ? ?Discussed viral etiology and risk of spread.  Discussed multiple treatments may be required to clear warts.  Discussed possible post-treatment dyspigmentation and risk of recurrence. ? ?Candida Antigen x 1 ?LN2 x 1 ?Cont Skin Medicinal Wart past 3x/wk to L thumb ? ?Intralesional injection - L thumb ?Location: L thumb ? ?Informed Consent: Discussed risks (infection, pain, bleeding, bruising, thinning of the skin, loss of skin pigment, lack of resolution, and recurrence of lesion) and benefits of the procedure, as well as the alternatives. Informed consent was obtained. ?Preparation: The area was prepared a standard fashion. ? ?Procedure Details: An intralesional injection was performed with candida antigen. 0.5 cc in total were injected. ? ?Total number of injections: 1 ? ?Plan: The patient was instructed on post-op care. Recommend OTC analgesia as needed for pain. ? ?[place] [phone] ?Lot [id number] ?Exp 07/05/2022 ? ?Destruction of lesion - L thumb ?Complexity: simple   ?Destruction method: cryotherapy   ?Informed consent: discussed and consent  obtained   ?Timeout:  patient name, date of birth, surgical site, and procedure verified ?Lesion destroyed using liquid nitrogen: Yes   ?Region frozen until ice ball extended beyond lesion: Yes   ?Outcome: patient tolerated procedure well with no complications   ?Post-procedure details: wound care instructions given   ? ? ?Return in about 6 weeks (around 03/28/2022) for wart f/u. ? ?I, [name], RMA, am acting as scribe for [name], MD . ?Documentation: I have reviewed the above documentation for accuracy and completeness, and I agree with the above. ? ?[name], MD ? ?

## 2022-02-14 NOTE — Patient Instructions (Signed)

## 2022-02-15 ENCOUNTER — Encounter: Payer: Self-pay | Admitting: Dermatology

## 2022-04-04 ENCOUNTER — Ambulatory Visit: Payer: Medicare PPO | Admitting: Dermatology

## 2022-04-04 DIAGNOSIS — B079 Viral wart, unspecified: Secondary | ICD-10-CM

## 2022-04-04 NOTE — Progress Notes (Signed)
   Follow-Up Visit   Subjective  Ryan [name] is a 71 y.o. male who presents for the following: Warts (L thumb, 6wk f/u, Candida injections x 4, LN2, SM wart past 3x/wk).  The following portions of the chart were reviewed this encounter and updated as appropriate:   Tobacco  Allergies  Meds  Problems  Med Hx  Surg Hx  Fam Hx     Review of Systems:  No other skin or systemic complaints except as noted in HPI or Assessment and Plan.  Objective  Well appearing patient in no apparent distress; mood and affect are within normal limits.  A focused examination was performed including left thumb. Relevant physical exam findings are noted in the Assessment and Plan.  L thumb Verrucous papules -- Discussed viral etiology and contagion.    Assessment & Plan  Viral warts, unspecified type L thumb  Discussed viral etiology and risk of spread.  Discussed multiple treatments may be required to clear warts.  Discussed possible post-treatment dyspigmentation and risk of recurrence.  Candida injection #5 LN2 x 1 Cont Skin Medicinals wart past 3x/wk for 1 more month then d/c   Intralesional injection - L thumb Location: L thumb  Informed Consent: Discussed risks (infection, pain, bleeding, bruising, thinning of the skin, loss of skin pigment, lack of resolution, and recurrence of lesion) and benefits of the procedure, as well as the alternatives. Informed consent was obtained. Preparation: The area was prepared a standard fashion.  Procedure Details: An intralesional injection was performed with candida antigen. 0.07 cc in total were injected.  Total number of injections: 1  Plan: The patient was instructed on post-op care. Recommend OTC analgesia as needed for pain. [place] [phone]  Lot [id number] exp 07/11/22  Destruction of lesion - L thumb Complexity: simple   Destruction method: cryotherapy   Informed consent: discussed and consent obtained   Timeout:  patient name, date of  birth, surgical site, and procedure verified Lesion destroyed using liquid nitrogen: Yes   Region frozen until ice ball extended beyond lesion: Yes   Outcome: patient tolerated procedure well with no complications   Post-procedure details: wound care instructions given     Return in about 10 weeks (around 06/13/2022) for wart f/u.  I, Ryan [name], RMA, am acting as scribe for Ryan Ser, MD . Documentation: I have reviewed the above documentation for accuracy and completeness, and I agree with the above.  Ryan Ser, MD

## 2022-04-04 NOTE — Patient Instructions (Signed)

## 2022-04-14 ENCOUNTER — Encounter: Payer: Self-pay | Admitting: Dermatology

## 2022-06-13 ENCOUNTER — Ambulatory Visit: Payer: Medicare PPO | Admitting: Dermatology

## 2022-06-13 DIAGNOSIS — B079 Viral wart, unspecified: Secondary | ICD-10-CM

## 2022-06-13 DIAGNOSIS — L820 Inflamed seborrheic keratosis: Secondary | ICD-10-CM | POA: Diagnosis not present

## 2022-06-13 DIAGNOSIS — L739 Follicular disorder, unspecified: Secondary | ICD-10-CM | POA: Diagnosis not present

## 2022-06-13 NOTE — Patient Instructions (Addendum)
Wound Care Instructions  Cleanse wound gently with soap and water once a day then pat dry with clean gauze. Apply a thing coat of Petrolatum (petroleum jelly, "Vaseline") over the wound (unless you have an allergy to this). We recommend that you use a new, sterile tube of Vaseline. Do not pick or remove scabs. Do not remove the yellow or white "healing tissue" from the base of the wound.  Cover the wound with fresh, clean, nonstick gauze and secure with paper tape. You may use Band-Aids in place of gauze and tape if the would is small enough, but would recommend trimming much of the tape off as there is often too much. Sometimes Band-Aids can irritate the skin.  You should call the office for your biopsy report after 1 week if you have not already been contacted.  If you experience any problems, such as abnormal amounts of bleeding, swelling, significant bruising, significant pain, or evidence of infection, please call the office immediately.  FOR ADULT SURGERY PATIENTS: If you need something for pain relief you may take 1 extra strength Tylenol (acetaminophen) AND 2 Ibuprofen ('200mg'$  each) together every 4 hours as needed for pain. (do not take these if you are allergic to them or if you have a reason you should not take them.) Typically, you may only need pain medication for 1 to 3 days.     WARTPEEL INSTRUCTIONS may start after bx site healed if he sees any recurrance.  WartPEEL is a medicine to treat warts that has been prescribed for you and can be filled only at a special compounding pharmacy. Please call the pharmacy below to fill your prescription. Once they confirm your address and payment, they will mail you the medicine. DO NOT USE THIS MEDICINE IF YOU ARE PREGNANT OR THINKING OF BECOMING PREGNANT.  [name] Ph: [phone]   Must be dispensed in amber syringe to ensure quality of medication. PRIOR TO USING THIS MEDICATION, IT IS IMPORTANT TO INFORM YOUR  PHYSICIAN IF YOU ARE PREGNANT OR THINKING OF BECOMING PREGNANT. **This medication was custom compounded for you based on the prescription orders of your physician.  How to use this medication: Apply medication at bedtime. Apply very small amount of medication to a flat plastic applicator. Use the applicator to apply a thin layer directly onto the wart. Use care to avoid applying the medicine to healthy skin. The medicine will break down healthy skin as well as the warts. Cover the wart with the tape provided.  Put the cap back tightly on the syringe. Wash hands after applying the medicine. NEVER put the WartPEEL in the mouth, nose or eyes. Remove the occlusion in the morning and wash the area thoroughly.  In case of accidental ingestion or contact with the eye, nose or mouth, contact [place] or the local poison control.   What to expect: During the first few days of application the skin around the wart may swell and become white. This will slough off with continued applications. Normal Dosage: The medication is applied once daily for a time determined by your physician. Storage Requirements: Store this medication at room temperature. Keep out of reach of children. PROTECT FROM LIGHT. Expiration Date: The medication is good for four months from the date made. Do not keep outdated medication. Side effects: Rash and irritation, if medication is applied to good skin. Cautions and Warnings: Only apply the medication to the warts. Do not apply to good skin. Keep away from children. Do not  use on nose, eyes or the mouth.     Due to recent changes in healthcare laws, you may see results of your pathology and/or laboratory studies on MyChart before the doctors have had a chance to review them. We understand that in some cases there may be results that are confusing or concerning to you. Please understand that not all results are received at the same time and often the doctors may need to  interpret multiple results in order to provide you with the best plan of care or course of treatment. Therefore, we ask that you please give [place] 2 business days to thoroughly review all your results before contacting the office for clarification. Should we see a critical lab result, you will be contacted sooner.   If You Need Anything After Your Visit  If you have any questions or concerns for your doctor, please call our main line at [phone] and press option 4 to reach your doctor's medical assistant. If no one answers, please leave a voicemail as directed and we will return your call as soon as possible. Messages left after 4 pm will be answered the following business day.   You may also send [place] a message via [place]. We typically respond to MyChart messages within 1-2 business days.  For prescription refills, please ask your pharmacy to contact our office. Our fax number is [phone].  If you have an urgent issue when the clinic is closed that cannot wait until the next business day, you can page your doctor at the number below.    Please note that while we do our best to be available for urgent issues outside of office hours, we are not available 24/7.   If you have an urgent issue and are unable to reach [place], you may choose to seek medical care at your doctor's office, retail clinic, urgent care center, or emergency room.  If you have a medical emergency, please immediately call 911 or go to the emergency department.  Pager Numbers  - Dr. Nehemiah Massed: [phone]  - Dr. Laurence Ferrari: [phone]  - Dr. Nicole Kindred: [phone]  In the event of inclement weather, please call our main line at [phone] for an update on the status of any delays or closures.  Dermatology Medication Tips: Please keep the boxes that topical medications come in in order to help keep track of the instructions about where and how to use these. Pharmacies typically print the medication instructions only on the  boxes and not directly on the medication tubes.   If your medication is too expensive, please contact our office at [phone] option 4 or send [place] a message through [place].   We are unable to tell what your co-pay for medications will be in advance as this is different depending on your insurance coverage. However, we may be able to find a substitute medication at lower cost or fill out paperwork to get insurance to cover a needed medication.   If a prior authorization is required to get your medication covered by your insurance company, please allow [place] 1-2 business days to complete this process.  Drug prices often vary depending on where the prescription is filled and some pharmacies may offer cheaper prices.  The website www.goodrx.com contains coupons for medications through different pharmacies. The prices here do not account for what the cost may be with help from insurance (it may be cheaper with your insurance), but the website can give you the price if you did not use any insurance.  -  You can print the associated coupon and take it with your prescription to the pharmacy.  - You may also stop by our office during regular business hours and pick up a GoodRx coupon card.  - If you need your prescription sent electronically to a different pharmacy, notify our office through [facility] or by phone at [phone] option 4.     Si Usted Necesita Algo Despus de Su Visita  Tambin puede enviarnos un mensaje a travs de Pharmacist, community. Por lo general respondemos a los mensajes de MyChart en el transcurso de 1 a 2 das hbiles.  Para renovar recetas, por favor pida a su farmacia que se ponga en contacto con nuestra oficina. Harland Dingwall de fax es [place] [phone].  Si tiene un asunto urgente cuando la clnica est cerrada y que no puede esperar hasta el siguiente da hbil, puede llamar/localizar a su doctor(a) al nmero que aparece a continuacin.   Por favor, tenga en cuenta que  aunque hacemos todo lo posible para estar disponibles para asuntos urgentes fuera del horario de [place], no estamos disponibles las 24 horas del da, los 7 das de la [place].   Si tiene un problema urgente y no puede comunicarse con nosotros, puede optar por buscar atencin mdica  en el consultorio de su doctor(a), en una clnica privada, en un centro de atencin urgente o en una sala de emergencias.  Si tiene Engineering geologist, por favor llame inmediatamente al 911 o vaya a la sala de emergencias.  Nmeros de bper  - Dr. Nehemiah Massed: [phone]  - Dra. Moye: [phone]  - Dra. Nicole Kindred: [phone]  En caso de inclemencias del [place], por favor llame a Johnsie Kindred principal al [phone] para una actualizacin sobre el [place] de cualquier retraso o cierre.  Consejos para la medicacin en dermatologa: Por favor, guarde las cajas en las que vienen los medicamentos de uso tpico para ayudarle a seguir las instrucciones sobre dnde y cmo usarlos. Las farmacias generalmente imprimen las instrucciones del medicamento slo en las cajas y no directamente en los tubos del [place].   Si su medicamento es muy caro, por favor, pngase en contacto con Zigmund Daniel llamando al [phone] y presione la opcin 4 o envenos un mensaje a travs de Pharmacist, community.   No podemos decirle cul ser su copago por los medicamentos por adelantado ya que esto es diferente dependiendo de la cobertura de su seguro. Sin embargo, es posible que podamos encontrar un medicamento sustituto a Electrical engineer un formulario para que el seguro cubra el medicamento que se considera necesario.   Si se requiere una autorizacin previa para que su compaa de seguros [place] su medicamento, por favor permtanos de 1 a 2 das hbiles para completar este proceso.  Los precios de los medicamentos varan con frecuencia dependiendo del Environmental consultant de dnde se surte la receta y alguna farmacias pueden ofrecer precios ms  baratos.  El sitio web www.goodrx.com tiene cupones para medicamentos de Airline pilot. Los precios aqu no tienen en cuenta lo que podra costar con la ayuda del seguro (puede ser ms barato con su seguro), pero el sitio web puede darle el precio si no utiliz Research scientist (physical sciences).  - Puede imprimir el cupn correspondiente y llevarlo con su receta a la farmacia.  - Tambin puede pasar por nuestra oficina durante el horario de atencin regular y Charity fundraiser una tarjeta de cupones de GoodRx.  - Si necesita que su receta se enve electrnicamente a una farmacia diferente, informe  a nuestra oficina a travs de MyChart de [place] o por telfono llamando al [phone] y presione la opcin 4.

## 2022-06-13 NOTE — Progress Notes (Signed)
Follow-Up Visit   Subjective  Ryan [name] is a 71 y.o. male who presents for the following: Warts (L thumb, 10 wk f/u, pt d/c SM Wart Past ~3 wks ago, Candida injections x 5, LN2, pt has had ~12 yrs) and check spot (Mid back, wife just noticed). He also has some pimples on his neck he would like evaluated and treated today.  The following portions of the chart were reviewed this encounter and updated as appropriate:   Tobacco  Allergies  Meds  Problems  Med Hx  Surg Hx  Fam Hx     Review of Systems:  No other skin or systemic complaints except as noted in HPI or Assessment and Plan.  Objective  Well appearing patient in no apparent distress; mood and affect are within normal limits.  A focused examination was performed including back, left hand. Relevant physical exam findings are noted in the Assessment and Plan.  L thumb Verrucous papule 0.4 x 1.1cm     Mid Back x 1 Stuck on waxy paps with erythema  neck Follicular based paps neck   Assessment & Plan  Viral warts, unspecified type L thumb Persistent wart vs other r/o CA  Discussed viral etiology and risk of spread.  Discussed multiple treatments may be required to clear warts.  Discussed possible post-treatment dyspigmentation and risk of recurrence.  Shave removal and bx today Candida IL Injection today for a total of 6 Candida treatments Start Wartpeel qd to L thumb WartPEEL is a special compounded prescription medicine (5-fluorouracil/salicylic acid) used to treat warts. It works much faster than over the counter salicylic acid but is not paid for by insurance. It should be applied to the wart once a day and covered with a bandage. Care should be taken to avoid applying it to the normal skin surrounding the wart in order to avoid irritation. It should not be used by pregnant women.    Epidermal / dermal shaving - L thumb  Lesion diameter (cm):  1.1 Informed consent: discussed and consent obtained    Timeout: patient name, date of birth, surgical site, and procedure verified   Procedure prep:  Patient was prepped and draped in usual sterile fashion Prep type:  Isopropyl alcohol Anesthesia: the lesion was anesthetized in a standard fashion   Anesthetic:  1% lidocaine w/ epinephrine 1-100,000 buffered w/ 8.4% NaHCO3 (digital block with bupivicaine 3cc) Instrument used: flexible razor blade   Hemostasis achieved with: pressure, aluminum chloride and electrodesiccation   Outcome: patient tolerated procedure well   Post-procedure details: sterile dressing applied and wound care instructions given   Dressing type: bandage and bacitracin    Intralesional injection - L thumb Location: L thumb Informed Consent: Discussed risks (infection, pain, bleeding, bruising, thinning of the skin, loss of skin pigment, lack of resolution, and recurrence of lesion) and benefits of the procedure, as well as the alternatives. Informed consent was obtained. Preparation: The area was prepared a standard fashion. Procedure Details: An intralesional injection was performed with candida antigen. 0.1 cc in total were injected. Total number of injections: 1  Plan: The patient was instructed on post-op care. Recommend OTC analgesia as needed for pain.  Lot [id number] exp 07/11/22  Specimen 1 - Surgical pathology Differential Diagnosis: D48.5 Persistent wart vs other r/o CA Check Margins: No Verrucous papule 0.4 x 1.1cm  Inflamed seborrheic keratosis Mid Back x 1 Symptomatic, irritating, patient would like treated. Destruction of lesion - Mid Back x 1 Complexity: simple  Destruction method: cryotherapy   Informed consent: discussed and consent obtained   Timeout:  patient name, date of birth, surgical site, and procedure verified Lesion destroyed using liquid nitrogen: Yes   Region frozen until ice ball extended beyond lesion: Yes   Outcome: patient tolerated procedure well with no complications    Post-procedure details: wound care instructions given    Folliculitis neck Start Zilxi foam qd, sample x 1 [id number] 05/2022  Return in about 3 months (around 09/13/2022) for wart f/u. Documentation: I have reviewed the above documentation for accuracy and completeness, and I agree with the above.  [name], MD

## 2022-06-17 ENCOUNTER — Encounter: Payer: Self-pay | Admitting: Dermatology

## 2022-06-18 ENCOUNTER — Telehealth: Payer: Self-pay

## 2022-06-18 NOTE — Telephone Encounter (Signed)
Patient informed of pathology results 

## 2022-06-18 NOTE — Telephone Encounter (Signed)
-----   Message from Ralene Bathe, MD sent at 06/15/2022  4:38 PM EDT ----- Diagnosis Skin , left thumb VERRUCA VULGARIS, IRRITATED  Benign irritated viral wart Recheck next visit

## 2022-09-18 ENCOUNTER — Ambulatory Visit: Payer: Medicare PPO | Admitting: Dermatology

## 2022-09-18 DIAGNOSIS — B079 Viral wart, unspecified: Secondary | ICD-10-CM

## 2022-09-18 NOTE — Progress Notes (Unsigned)
Follow-Up Visit   Subjective  Ryan [name] is a 71 y.o. male who presents for the following: Warts (Using prescription wart medication on the L heel and L thumb - previously treated with LN2 and candida injections). L heel wart is new and he would like to start treatment on that today.  The following portions of the chart were reviewed this encounter and updated as appropriate:   Tobacco  Allergies  Meds  Problems  Med Hx  Surg Hx  Fam Hx     Review of Systems:  No other skin or systemic complaints except as noted in HPI or Assessment and Plan.  Objective  Well appearing patient in no apparent distress; mood and affect are within normal limits.  A focused examination was performed including the hands and feet. Relevant physical exam findings are noted in the Assessment and Plan.  L thumb x 1, L heel x 1 (2) Verrucous papules -- Discussed viral etiology and contagion.    Assessment & Plan  Viral warts, unspecified type (2) L thumb x 1, L heel x 1  Viral Wart (HPV) Counseling  Discussed viral / HPV (Human Papilloma Virus) etiology and risk of spread /infectivity to other areas of body as well as to other people.  Multiple treatments and methods may be required to clear warts and it is possible treatment may not be successful.  Treatment risks include discoloration; scarring and there is still potential for wart recurrence.  Continue prescription strength wart remover QHS under occulusion.  Candida IL Injection today for a total of 7 Candida treatments  Destruction of lesion - L thumb x 1, L heel x 1 Complexity: simple   Destruction method: cryotherapy   Informed consent: discussed and consent obtained   Timeout:  patient name, date of birth, surgical site, and procedure verified Lesion destroyed using liquid nitrogen: Yes   Region frozen until ice ball extended beyond lesion: Yes   Outcome: patient tolerated procedure well with no complications   Post-procedure  details: wound care instructions given    Intralesional injection - L thumb x 1, L heel x 1 Location: L thumb  Informed Consent: Discussed risks (infection, pain, bleeding, bruising, thinning of the skin, loss of skin pigment, lack of resolution, and recurrence of lesion) and benefits of the procedure, as well as the alternatives. Informed consent was obtained. Preparation: The area was prepared a standard fashion.  Procedure Details: An intralesional injection was performed with candida antigen. 0.1 cc in total were injected.  Total number of injections: 1  Plan: The patient was instructed on post-op care. Recommend OTC analgesia as needed for pain.   Destruction of lesion - L thumb x 1, L heel x 1  Destruction method: chemical removal   Informed consent: discussed and consent obtained   Timeout:  patient name, date of birth, surgical site, and procedure verified Chemical destruction method comment:  Squaric acid 3%, cantharidin plus Procedure instructions: patient instructed to wash and dry area   Outcome: patient tolerated procedure well with no complications   Post-procedure details: wound care instructions given   Additional details:  Patient advised to set alarm to remind them to wash off with soap and water at the directed time.   Return for wart follow up in 4-6 weeks.  Ryan [name], CMA, am acting as scribe for Ryan Ser, MD . Documentation: I have reviewed the above documentation for accuracy and completeness, and I agree with the above.  Ryan Ser, MD

## 2022-09-18 NOTE — Patient Instructions (Signed)
Due to recent changes in healthcare laws, you may see results of your pathology and/or laboratory studies on MyChart before the doctors have had a chance to review them. We understand that in some cases there may be results that are confusing or concerning to you. Please understand that not all results are received at the same time and often the doctors may need to interpret multiple results in order to provide you with the best plan of care or course of treatment. Therefore, we ask that you please give us 2 business days to thoroughly review all your results before contacting the office for clarification. Should we see a critical lab result, you will be contacted sooner.   If You Need Anything After Your Visit  If you have any questions or concerns for your doctor, please call our main line at [phone] and press option 4 to reach your doctor's medical assistant. If no one answers, please leave a voicemail as directed and we will return your call as soon as possible. Messages left after 4 pm will be answered the following business day.   You may also send us a message via MyChart. We typically respond to MyChart messages within 1-2 business days.  For prescription refills, please ask your pharmacy to contact our office. Our fax number is [phone].  If you have an urgent issue when the clinic is closed that cannot wait until the next business day, you can page your doctor at the number below.    Please note that while we do our best to be available for urgent issues outside of office hours, we are not available 24/7.   If you have an urgent issue and are unable to reach us, you may choose to seek medical care at your doctor's office, retail clinic, urgent care center, or emergency room.  If you have a medical emergency, please immediately call 911 or go to the emergency department.  Pager Numbers  - Dr. Kowalski: [phone]  - Dr. Moye: [phone]  - Dr. Stewart:  [phone]  In the event of inclement weather, please call our main line at [phone] for an update on the status of any delays or closures.  Dermatology Medication Tips: Please keep the boxes that topical medications come in in order to help keep track of the instructions about where and how to use these. Pharmacies typically print the medication instructions only on the boxes and not directly on the medication tubes.   If your medication is too expensive, please contact our office at [phone] option 4 or send us a message through MyChart.   We are unable to tell what your co-pay for medications will be in advance as this is different depending on your insurance coverage. However, we may be able to find a substitute medication at lower cost or fill out paperwork to get insurance to cover a needed medication.   If a prior authorization is required to get your medication covered by your insurance company, please allow us 1-2 business days to complete this process.  Drug prices often vary depending on where the prescription is filled and some pharmacies may offer cheaper prices.  The website www.goodrx.com contains coupons for medications through different pharmacies. The prices here do not account for what the cost may be with help from insurance (it may be cheaper with your insurance), but the website can give you the price if you did not use any insurance.  - You can print the associated coupon and take it with   your prescription to the pharmacy.  - You may also stop by our office during regular business hours and pick up a GoodRx coupon card.  - If you need your prescription sent electronically to a different pharmacy, notify our office through [facility] MyChart or by phone at [phone] option 4.     Si Usted Necesita Algo Despus de Su Visita  Tambin puede enviarnos un mensaje a travs de MyChart. Por lo general respondemos a los mensajes de MyChart en el transcurso de 1 a 2  das hbiles.  Para renovar recetas, por favor pida a su farmacia que se ponga en contacto con nuestra oficina. Nuestro nmero de fax es el [phone].  Si tiene un asunto urgente cuando la clnica est cerrada y que no puede esperar hasta el siguiente da hbil, puede llamar/localizar a su doctor(a) al nmero que aparece a continuacin.   Por favor, tenga en cuenta que aunque hacemos todo lo posible para estar disponibles para asuntos urgentes fuera del horario de oficina, no estamos disponibles las 24 horas del da, los 7 das de la semana.   Si tiene un problema urgente y no puede comunicarse con nosotros, puede optar por buscar atencin mdica  en el consultorio de su doctor(a), en una clnica privada, en un centro de atencin urgente o en una sala de emergencias.  Si tiene una emergencia mdica, por favor llame inmediatamente al 911 o vaya a la sala de emergencias.  Nmeros de bper  - Dr. Kowalski: [phone]  - Dra. Moye: [phone]  - Dra. Stewart: [phone]  En caso de inclemencias del tiempo, por favor llame a nuestra lnea principal al [phone] para una actualizacin sobre el estado de cualquier retraso o cierre.  Consejos para la medicacin en dermatologa: Por favor, guarde las cajas en las que vienen los medicamentos de uso tpico para ayudarle a seguir las instrucciones sobre dnde y cmo usarlos. Las farmacias generalmente imprimen las instrucciones del medicamento slo en las cajas y no directamente en los tubos del medicamento.   Si su medicamento es muy caro, por favor, pngase en contacto con nuestra oficina llamando al [phone] y presione la opcin 4 o envenos un mensaje a travs de MyChart.   No podemos decirle cul ser su copago por los medicamentos por adelantado ya que esto es diferente dependiendo de la cobertura de su seguro. Sin embargo, es posible que podamos encontrar un medicamento sustituto a menor costo o llenar un formulario para que el  seguro cubra el medicamento que se considera necesario.   Si se requiere una autorizacin previa para que su compaa de seguros cubra su medicamento, por favor permtanos de 1 a 2 das hbiles para completar este proceso.  Los precios de los medicamentos varan con frecuencia dependiendo del lugar de dnde se surte la receta y alguna farmacias pueden ofrecer precios ms baratos.  El sitio web www.goodrx.com tiene cupones para medicamentos de diferentes farmacias. Los precios aqu no tienen en cuenta lo que podra costar con la ayuda del seguro (puede ser ms barato con su seguro), pero el sitio web puede darle el precio si no utiliz ningn seguro.  - Puede imprimir el cupn correspondiente y llevarlo con su receta a la farmacia.  - Tambin puede pasar por nuestra oficina durante el horario de atencin regular y recoger una tarjeta de cupones de GoodRx.  - Si necesita que su receta se enve electrnicamente a una farmacia diferente, informe a nuestra oficina a travs de MyChart de [place]   o por telfono llamando al [phone] y presione la opcin 4.  

## 2022-09-19 ENCOUNTER — Encounter: Payer: Self-pay | Admitting: Dermatology

## 2022-11-22 ENCOUNTER — Ambulatory Visit: Payer: Medicare PPO | Admitting: Dermatology

## 2022-11-22 VITALS — HR 77

## 2022-11-22 DIAGNOSIS — B079 Viral wart, unspecified: Secondary | ICD-10-CM | POA: Diagnosis not present

## 2022-11-22 DIAGNOSIS — Z79899 Other long term (current) drug therapy: Secondary | ICD-10-CM

## 2022-11-22 NOTE — Progress Notes (Signed)
Follow-Up Visit   Subjective  Ryan [name] is a 72 y.o. male who presents for the following: Warts (L thumb, L heel, 62mf/u,  Candida IL injections x 7, LN2, Squaric Acid 3%, Cantharidin plus, using compounded wart peel on thumb).  The following portions of the chart were reviewed this encounter and updated as appropriate:   Tobacco  Allergies  Meds  Problems  Med Hx  Surg Hx  Fam Hx     Review of Systems:  No other skin or systemic complaints except as noted in HPI or Assessment and Plan.  Objective  Well appearing patient in no apparent distress; mood and affect are within normal limits.  A focused examination was performed including left hand, left heel. Relevant physical exam findings are noted in the Assessment and Plan.  L thumb sub ungual x 1, L heel x 1 Verrucous paps L thumb sub ungual, L heel   Assessment & Plan  Viral warts, unspecified type L thumb sub ungual x 1, L heel x 1  Viral Wart (HPV) Counseling  Discussed viral / HPV (Human Papilloma Virus) etiology and risk of spread /infectivity to other areas of body as well as to other people.  Multiple treatments and methods may be required to clear warts and it is possible treatment may not be successful.  Treatment risks include discoloration; scarring and there is still potential for wart recurrence.  LN2 x 2 Candida Injection # 8 to L thumb sub ungual Squaric acid x 2 Cantharidin plus x 2  Start Wart peel to L heel qhs  Destruction of lesion - L thumb sub ungual x 1, L heel x 1 Complexity: simple   Destruction method: cryotherapy   Informed consent: discussed and consent obtained   Timeout:  patient name, date of birth, surgical site, and procedure verified Lesion destroyed using liquid nitrogen: Yes   Region frozen until ice ball extended beyond lesion: Yes   Outcome: patient tolerated procedure well with no complications   Post-procedure details: wound care instructions given    Intralesional  injection - L thumb sub ungual x 1, L heel x 1 Location: L thumb sub ungual  Informed Consent: Discussed risks (infection, pain, bleeding, bruising, thinning of the skin, loss of skin pigment, lack of resolution, and recurrence of lesion) and benefits of the procedure, as well as the alternatives. Informed consent was obtained. Preparation: The area was prepared a standard fashion.  Procedure Details: An intralesional injection was performed with candida antigen. 0.1 cc in total were injected.  Total number of injections: 1  Plan: The patient was instructed on post-op care. Recommend OTC analgesia as needed for pain.   [id number] [id number] 04/2023  Destruction of lesion - L thumb sub ungual x 1, L heel x 1  Destruction method: chemical removal   Informed consent: discussed and consent obtained   Timeout:  patient name, date of birth, surgical site, and procedure verified Chemical destruction method comment:  Squaric Acid 3%, Cantharidin plus Procedure instructions: patient instructed to wash and dry area   Outcome: patient tolerated procedure well with no complications   Post-procedure details: wound care instructions given   Additional details:  Cantharidin Plus is a blistering agent that comes from a beetle.  It needs to be washed off in about 4-6 hours after application.  Although it is painless when applied in office, it may cause symptoms of mild pain and burning several hours later.  Treated areas will swell  and turn red, and blisters may form.  Vaseline and a bandaid may be applied until wound has healed.  Once healed, the skin may remain temporarily discolored.  It can take weeks to months for pigmentation to return to normal.  Advised to wash off with soap and water in 4-6 hours or sooner if it becomes tender before then.    Return in about 2 months (around 01/21/2023) for wart f/u.  I, Ryan [name], RMA, am acting as scribe for Ryan Ser, MD . Documentation: I  have reviewed the above documentation for accuracy and completeness, and I agree with the above.  Ryan Ser, MD

## 2022-11-22 NOTE — Patient Instructions (Addendum)
Cryotherapy Aftercare  Wash gently with soap and water everyday.   Apply Vaseline and Band-Aid daily until healed.    Instructions for After In-Office Application of Cantharidin  1. This is a strong medicine; please follow ALL instructions.  2. Gently wash off with soap and water in four hours or sooner s directed by your physician.  3. **WARNING** this medicine can cause severe blistering, blood blisters, infection, and/or scarring if it is not washed off as directed.  4. Your progress will be rechecked in 1-2 months; call sooner if there are any questions or problems.    Due to recent changes in healthcare laws, you may see results of your pathology and/or laboratory studies on MyChart before the doctors have had a chance to review them. We understand that in some cases there may be results that are confusing or concerning to you. Please understand that not all results are received at the same time and often the doctors may need to interpret multiple results in order to provide you with the best plan of care or course of treatment. Therefore, we ask that you please give [place] 2 business days to thoroughly review all your results before contacting the office for clarification. Should we see a critical lab result, you will be contacted sooner.   If You Need Anything After Your Visit  If you have any questions or concerns for your doctor, please call our main line at [phone] and press option 4 to reach your doctor's medical assistant. If no one answers, please leave a voicemail as directed and we will return your call as soon as possible. Messages left after 4 pm will be answered the following business day.   You may also send [place] a message via [place]. We typically respond to MyChart messages within 1-2 business days.  For prescription refills, please ask your pharmacy to contact our office. Our fax number is [phone].  If you have an urgent issue when the clinic is closed that  cannot wait until the next business day, you can page your doctor at the number below.    Please note that while we do our best to be available for urgent issues outside of office hours, we are not available 24/7.   If you have an urgent issue and are unable to reach [place], you may choose to seek medical care at your doctor's office, retail clinic, urgent care center, or emergency room.  If you have a medical emergency, please immediately call 911 or go to the emergency department.  Pager Numbers  - Dr. Nehemiah Massed: [phone]  - Dr. Laurence Ferrari: [phone]  - Dr. Nicole Kindred: [phone]  In the event of inclement weather, please call our main line at [phone] for an update on the status of any delays or closures.  Dermatology Medication Tips: Please keep the boxes that topical medications come in in order to help keep track of the instructions about where and how to use these. Pharmacies typically print the medication instructions only on the boxes and not directly on the medication tubes.   If your medication is too expensive, please contact our office at [phone] option 4 or send [place] a message through [place].   We are unable to tell what your co-pay for medications will be in advance as this is different depending on your insurance coverage. However, we may be able to find a substitute medication at lower cost or fill out paperwork to get insurance to cover a needed medication.   If a  prior authorization is required to get your medication covered by your insurance company, please allow [place] 1-2 business days to complete this process.  Drug prices often vary depending on where the prescription is filled and some pharmacies may offer cheaper prices.  The website www.goodrx.com contains coupons for medications through different pharmacies. The prices here do not account for what the cost may be with help from insurance (it may be cheaper with your insurance), but the website can give you the  price if you did not use any insurance.  - You can print the associated coupon and take it with your prescription to the pharmacy.  - You may also stop by our office during regular business hours and pick up a GoodRx coupon card.  - If you need your prescription sent electronically to a different pharmacy, notify our office through [facility] or by phone at [phone] option 4.     Si Usted Necesita Algo Despus de Su Visita  Tambin puede enviarnos un mensaje a travs de Pharmacist, community. Por lo general respondemos a los mensajes de MyChart en el transcurso de 1 a 2 das hbiles.  Para renovar recetas, por favor pida a su farmacia que se ponga en contacto con nuestra oficina. Harland Dingwall de fax es [place] [phone].  Si tiene un asunto urgente cuando la clnica est cerrada y que no puede esperar hasta el siguiente da hbil, puede llamar/localizar a su doctor(a) al nmero que aparece a continuacin.   Por favor, tenga en cuenta que aunque hacemos todo lo posible para estar disponibles para asuntos urgentes fuera del horario de [place], no estamos disponibles las 24 horas del da, los 7 das de la [place].   Si tiene un problema urgente y no puede comunicarse con nosotros, puede optar por buscar atencin mdica  en el consultorio de su doctor(a), en una clnica privada, en un centro de atencin urgente o en una sala de emergencias.  Si tiene Engineering geologist, por favor llame inmediatamente al 911 o vaya a la sala de emergencias.  Nmeros de bper  - Dr. Nehemiah Massed: [phone]  - Dra. Moye: [phone]  - Dra. Nicole Kindred: [phone]  En caso de inclemencias del [place], por favor llame a [place]ie Kindred principal al [phone] para una actualizacin sobre el [place] de cualquier retraso o cierre.  Consejos para la medicacin en dermatologa: Por favor, guarde las cajas en las que vienen los medicamentos de uso tpico para ayudarle a seguir las instrucciones sobre dnde y cmo  usarlos. Las farmacias generalmente imprimen las instrucciones del medicamento slo en las cajas y no directamente en los tubos del [place].   Si su medicamento es muy caro, por favor, pngase en contacto con Zigmund Daniel llamando al [phone] y presione la opcin 4 o envenos un mensaje a travs de Pharmacist, community.   No podemos decirle cul ser su copago por los medicamentos por adelantado ya que esto es diferente dependiendo de la cobertura de su seguro. Sin embargo, es posible que podamos encontrar un medicamento sustituto a Electrical engineer un formulario para que el seguro cubra el medicamento que se considera necesario.   Si se requiere una autorizacin previa para que su compaa de seguros [place] su medicamento, por favor permtanos de 1 a 2 das hbiles para completar este proceso.  Los precios de los medicamentos varan con frecuencia dependiendo del Environmental consultant de dnde se surte la receta y alguna farmacias pueden ofrecer precios ms baratos.  El sitio web www.goodrx.com tiene cupones  para medicamentos de diferentes farmacias. Los precios aqu no tienen en cuenta lo que podra costar con la ayuda del seguro (puede ser ms barato con su seguro), pero el sitio web puede darle el precio si no utiliz ningn seguro.  - Puede imprimir el cupn correspondiente y llevarlo con su receta a la farmacia.  - Tambin puede pasar por nuestra oficina durante el horario de atencin regular y recoger una tarjeta de cupones de GoodRx.  - Si necesita que su receta se enve electrnicamente a una farmacia diferente, informe a nuestra oficina a travs de MyChart de [place] o por telfono llamando al [phone] y presione la opcin 4.  

## 2022-11-24 ENCOUNTER — Encounter: Payer: Self-pay | Admitting: Dermatology

## 2023-01-09 ENCOUNTER — Encounter: Payer: Self-pay | Admitting: Ophthalmology

## 2023-01-10 NOTE — Discharge Instructions (Signed)

## 2023-01-14 ENCOUNTER — Other Ambulatory Visit: Payer: Self-pay

## 2023-01-14 ENCOUNTER — Ambulatory Visit: Payer: Medicare PPO | Admitting: Anesthesiology

## 2023-01-14 ENCOUNTER — Encounter
Admission: RE | Disposition: A | Discharge status: Home / Self Care | Payer: Self-pay | Source: Home / Self Care | Attending: Ophthalmology

## 2023-01-14 ENCOUNTER — Ambulatory Visit
Admission: RE | Admit: 2023-01-14 | Discharge: 2023-01-14 | Disposition: A | Discharge status: Home / Self Care | Payer: Medicare PPO | Attending: Ophthalmology | Admitting: Ophthalmology

## 2023-01-14 ENCOUNTER — Encounter: Payer: Self-pay | Admitting: Ophthalmology

## 2023-01-14 DIAGNOSIS — Z8673 Personal history of transient ischemic attack (TIA), and cerebral infarction without residual deficits: Secondary | ICD-10-CM | POA: Diagnosis not present

## 2023-01-14 DIAGNOSIS — K219 Gastro-esophageal reflux disease without esophagitis: Secondary | ICD-10-CM | POA: Insufficient documentation

## 2023-01-14 DIAGNOSIS — Z79899 Other long term (current) drug therapy: Secondary | ICD-10-CM | POA: Diagnosis not present

## 2023-01-14 DIAGNOSIS — H2512 Age-related nuclear cataract, left eye: Secondary | ICD-10-CM | POA: Insufficient documentation

## 2023-01-14 DIAGNOSIS — G4730 Sleep apnea, unspecified: Secondary | ICD-10-CM | POA: Insufficient documentation

## 2023-01-14 DIAGNOSIS — I10 Essential (primary) hypertension: Secondary | ICD-10-CM | POA: Insufficient documentation

## 2023-01-14 DIAGNOSIS — D649 Anemia, unspecified: Secondary | ICD-10-CM | POA: Diagnosis not present

## 2023-01-14 HISTORY — DX: Essential (primary) hypertension: I10

## 2023-01-14 HISTORY — DX: Paroxysmal atrial fibrillation: I48.0

## 2023-01-14 HISTORY — PX: CATARACT EXTRACTION W/PHACO: SHX586

## 2023-01-14 HISTORY — DX: Gastro-esophageal reflux disease without esophagitis: K21.9

## 2023-01-14 HISTORY — DX: Sleep apnea, unspecified: G47.30

## 2023-01-14 SURGERY — PHACOEMULSIFICATION, CATARACT, WITH IOL INSERTION
Anesthesia: Monitor Anesthesia Care | Site: Eye | Laterality: Left

## 2023-01-14 MED ORDER — FENTANYL CITRATE (PF) 100 MCG/2ML IJ SOLN
INTRAMUSCULAR | Status: DC | PRN
  Administered 2023-01-14 (×2): 50 ug via INTRAVENOUS

## 2023-01-14 MED ORDER — MOXIFLOXACIN HCL 0.5 % OP SOLN
OPHTHALMIC | Status: DC | PRN
  Administered 2023-01-14: .2 mL via OPHTHALMIC

## 2023-01-14 MED ORDER — SIGHTPATH DOSE#1 BSS IO SOLN
INTRAOCULAR | Status: DC | PRN
  Administered 2023-01-14: 15 mL

## 2023-01-14 MED ORDER — SIGHTPATH DOSE#1 BSS IO SOLN
INTRAOCULAR | Status: DC | PRN
  Administered 2023-01-14: 91 mL via OPHTHALMIC

## 2023-01-14 MED ORDER — SIGHTPATH DOSE#1 NA HYALUR & NA CHOND-NA HYALUR IO KIT
PACK | INTRAOCULAR | Status: DC | PRN
  Administered 2023-01-14: 1 via OPHTHALMIC

## 2023-01-14 MED ORDER — ARMC OPHTHALMIC DILATING DROPS
1.0000 | OPHTHALMIC | Status: DC | PRN
  Administered 2023-01-14 (×3): 1 via OPHTHALMIC

## 2023-01-14 MED ORDER — LACTATED RINGERS IV SOLN: INTRAVENOUS | Status: DC

## 2023-01-14 MED ORDER — LIDOCAINE HCL (PF) 2 % IJ SOLN
INTRAOCULAR | Status: DC | PRN
  Administered 2023-01-14: 1 mL via INTRAOCULAR

## 2023-01-14 MED ORDER — TETRACAINE HCL 0.5 % OP SOLN
1.0000 [drp] | OPHTHALMIC | Status: DC | PRN
  Administered 2023-01-14 (×3): 1 [drp] via OPHTHALMIC

## 2023-01-14 MED ORDER — MIDAZOLAM HCL 2 MG/2ML IJ SOLN
INTRAMUSCULAR | Status: DC | PRN
  Administered 2023-01-14 (×2): 1 mg via INTRAVENOUS

## 2023-01-14 SURGICAL SUPPLY — 13 items
CATARACT SUITE SIGHTPATH (MISCELLANEOUS) ×1 IMPLANT
DISSECTOR HYDRO NUCLEUS 50X22 (MISCELLANEOUS) ×1 IMPLANT
FEE CATARACT SUITE SIGHTPATH (MISCELLANEOUS) ×1 IMPLANT
GLOVE SURG GAMMEX PI TX LF 7.5 (GLOVE) ×1 IMPLANT
GLOVE SURG SYN 8.5  E (GLOVE) ×1
GLOVE SURG SYN 8.5 E (GLOVE) ×1 IMPLANT
GLOVE SURG SYN 8.5 PF PI (GLOVE) ×1 IMPLANT
LENS IOL TECNIS EYHANCE 19.0 (Intraocular Lens) IMPLANT
NDL FILTER BLUNT 18X1 1/2 (NEEDLE) ×1 IMPLANT
NEEDLE FILTER BLUNT 18X1 1/2 (NEEDLE) ×1 IMPLANT
SYR 3ML LL SCALE MARK (SYRINGE) ×1 IMPLANT
SYR 5ML LL (SYRINGE) ×1 IMPLANT
WATER STERILE IRR 250ML POUR (IV SOLUTION) ×1 IMPLANT

## 2023-01-14 NOTE — H&P (Signed)
[facility]   Primary Care Physician:  Derinda Late, MD Ophthalmologist: Dr. Benay Pillow  Pre-Procedure History & Physical: HPI:  Ryan [name] is a 72 y.o. male here for cataract surgery.   Past Medical History:  Diagnosis Date   Anemia    Clotting disorder ([place])    DVT (deep venous thrombosis) ([place]) 2017   left leg   GERD (gastroesophageal reflux disease)    Hypertension    PAF (paroxysmal atrial fibrillation) ([place])    Sleep apnea    can't tolerate CPAP   Stroke ([place]) 01/16/2020   No deficits    Past Surgical History:  Procedure Laterality Date   APPENDECTOMY  2002   COLONOSCOPY  2006   2003,2007,2008,2020   [place]    Prior to Admission medications   Medication Sig Start Date End Date Taking? Authorizing Provider  Acetaminophen (TYLENOL ARTHRITIS PAIN PO) Take 650 mg by mouth 3 (three) times daily.   Yes [provider]  atorvastatin (LIPITOR) 40 MG tablet Take 1 tablet (40 mg total) by mouth at bedtime. 11/16/20  Yes Kathie Dike, MD  cetirizine (ZYRTEC) 10 MG tablet Take 10 mg by mouth at bedtime.   Yes [provider]  Cholecalciferol (VITAMIN D3) 2000 units capsule Take 2,000 Units by mouth daily.   Yes [provider]  diltiazem (DILACOR XR) 120 MG 24 hr capsule Take 120 mg by mouth daily.   Yes [provider]  ELIQUIS 5 MG TABS tablet Take 1 tablet (5 mg total) by mouth 2 (two) times daily. Resume on 11/19/2020 11/16/20  Yes Kathie Dike, MD  Ferrous Sulfate Dried 45 MG TBCR Take 1 tablet by mouth at bedtime.   Yes [provider]  gabapentin (NEURONTIN) 100 MG capsule Take 100 mg by mouth 3 (three) times daily. 07/11/20  Yes [provider]  omeprazole (PRILOSEC) 20 MG capsule Take 20 mg by mouth daily. 03/09/17  Yes [provider]  traMADol (ULTRAM) 50 MG tablet Take by mouth every 6 (six) hours as needed.   Yes [provider]  vitamin B-12 (CYANOCOBALAMIN) 1000  MCG tablet Take 1,000 mcg by mouth daily.   Yes [provider]    Allergies as of 12/18/2022 - Review Complete 11/24/2022  Allergen Reaction Noted   Amoxicillin-pot clavulanate Diarrhea 08/03/2014   Nitroglycerin Other (See Comments) 05/23/2017    Family History  Problem Relation Age of Onset   Cancer Maternal Aunt    Cancer Paternal Uncle     Social History   Socioeconomic History   Marital status: Married    Spouse name: Not on file   Number of children: Not on file   Years of education: Not on file   Highest education level: Not on file  Occupational History   Not on file  Tobacco Use   Smoking status: Never   Smokeless tobacco: Never  Vaping Use   Vaping Use: Never used  Substance and Sexual Activity   Alcohol use: Yes    Comment: rare   Drug use: Not on file   Sexual activity: Yes  Other Topics Concern   Not on file  Social History Narrative   Not on file   Social Determinants of Health   Financial Resource Strain: Not on file  Food Insecurity: Not on file  Transportation Needs: Not on file  Physical Activity: Not on file  Stress: Not on file  Social Connections: Not on file  Intimate Partner Violence: Not on  file    Review of Systems: See HPI, otherwise negative ROS  Physical Exam: BP (!) 158/92   Pulse 73   Temp 97.7 F (36.5 C) (Temporal)   Resp 16   Ht '5\' 7"'$  (1.702 m)   Wt 100.2 kg   SpO2 96%   BMI 34.61 kg/m  General:   Alert, cooperative in NAD Head:  Normocephalic and atraumatic. Respiratory:  Normal work of breathing. Cardiovascular:  RRR  Impression/Plan: Ryan [name] is here for cataract surgery.  Risks, benefits, limitations, and alternatives regarding cataract surgery have been reviewed with the patient.  Questions have been answered.  All parties agreeable.   Benay Pillow, MD  01/14/2023, 9:17 AM

## 2023-01-14 NOTE — Transfer of Care (Signed)
Immediate Anesthesia Transfer of Care Note  Patient: Ryan [name]  Procedure(s) Performed: CATARACT EXTRACTION PHACO AND INTRAOCULAR LENS PLACEMENT (IOC) LEFT DIABETIC  4.91  00:37.0 (Left: Eye)  Patient Location: PACU  Anesthesia Type: MAC  Level of Consciousness: awake, alert  and patient cooperative  Airway and Oxygen Therapy: Patient Spontanous Breathing and Patient connected to supplemental oxygen  Post-op Assessment: Post-op Vital signs reviewed, Patient's Cardiovascular Status Stable, Respiratory Function Stable, Patent Airway and No signs of Nausea or vomiting  Post-op Vital Signs: Reviewed and stable  Complications: No notable events documented.

## 2023-01-14 NOTE — Anesthesia Preprocedure Evaluation (Signed)
Anesthesia Evaluation  Patient identified by MRN, date of birth, ID band Patient awake    Reviewed: Allergy & Precautions, H&P , NPO status , Patient's Chart, lab work & pertinent test results, reviewed documented beta blocker date and time   Airway Mallampati: II  TM Distance: >3 FB Neck ROM: full    Dental no notable dental hx. (+) Teeth Intact   Pulmonary sleep apnea and Continuous Positive Airway Pressure Ventilation    Pulmonary exam normal breath sounds clear to auscultation       Cardiovascular Exercise Tolerance: Poor hypertension, On Medications negative cardio ROS  Rhythm:regular Rate:Normal     Neuro/Psych CVA, No Residual Symptoms  negative psych ROS   GI/Hepatic Neg liver ROS,GERD  Medicated,,  Endo/Other  negative endocrine ROSdiabetes, Well Controlled    Renal/GU      Musculoskeletal   Abdominal   Peds  Hematology  (+) Blood dyscrasia, anemia   Anesthesia Other Findings   Reproductive/Obstetrics negative OB ROS                             Anesthesia Physical Anesthesia Plan  ASA: 3  Anesthesia Plan: MAC   Post-op Pain Management:    Induction:   PONV Risk Score and Plan:   Airway Management Planned:   Additional Equipment:   Intra-op Plan:   Post-operative Plan:   Informed Consent: I have reviewed the patients History and Physical, chart, labs and discussed the procedure including the risks, benefits and alternatives for the proposed anesthesia with the patient or authorized representative who has indicated his/her understanding and acceptance.       Plan Discussed with: CRNA  Anesthesia Plan Comments:        Anesthesia Quick Evaluation

## 2023-01-14 NOTE — Anesthesia Postprocedure Evaluation (Signed)
Anesthesia Post Note  Patient: JAHMIR SOKALSKI  Procedure(s) Performed: CATARACT EXTRACTION PHACO AND INTRAOCULAR LENS PLACEMENT (IOC) LEFT DIABETIC  4.91  00:37.0 (Left: Eye)  Patient location during evaluation: PACU Anesthesia Type: MAC Level of consciousness: awake and alert Pain management: pain level controlled Vital Signs Assessment: post-procedure vital signs reviewed and stable Respiratory status: spontaneous breathing, nonlabored ventilation, respiratory function stable and patient connected to nasal cannula oxygen Cardiovascular status: stable and blood pressure returned to baseline Postop Assessment: no apparent nausea or vomiting Anesthetic complications: no   No notable events documented.   Last Vitals:  Vitals:   01/14/23 0943 01/14/23 0950  BP: 133/81 (!) 136/91  Pulse: 74   Resp: 18   Temp: (!) 36.3 C (!) 36.4 C  SpO2: 95% 95%    Last Pain:  Vitals:   01/14/23 0950  TempSrc:   PainSc: 0-No pain                 Molli Barrows

## 2023-01-14 NOTE — Op Note (Signed)
OPERATIVE NOTE  Ryan [name] [id number] 01/14/2023   PREOPERATIVE DIAGNOSIS:  Nuclear sclerotic cataract left eye.  H25.12   POSTOPERATIVE DIAGNOSIS:    Nuclear sclerotic cataract left eye.     PROCEDURE:  Phacoemusification with posterior chamber intraocular lens placement of the left eye   LENS:   Implant Name Type Inv. Item Serial No. Manufacturer Lot No. LRB No. Used Action  LENS IOL TECNIS EYHANCE 19.0 - [id number] Intraocular Lens LENS IOL TECNIS EYHANCE 19.0 [id number] [employer]  Left 1 Implanted      Procedure(s) with comments: CATARACT EXTRACTION PHACO AND INTRAOCULAR LENS PLACEMENT (IOC) LEFT DIABETIC  4.91  00:37.0 (Left) - sleep apnea  DIB00 +19.0   ULTRASOUND TIME: 0 minutes 37 seconds.  CDE 4.91   SURGEON:  Benay Pillow, MD, MPH   ANESTHESIA:  Topical with tetracaine drops augmented with 1% preservative-free intracameral lidocaine.  ESTIMATED BLOOD LOSS: <1 mL   COMPLICATIONS:  None.   DESCRIPTION OF PROCEDURE:  The patient was identified in the holding room and transported to the operating room and placed in the supine position under the operating microscope.  The left eye was identified as the operative eye and it was prepped and draped in the usual sterile ophthalmic fashion.   A 1.0 millimeter clear-corneal paracentesis was made at the 5:00 position. 0.5 ml of preservative-free 1% lidocaine with epinephrine was injected into the anterior chamber.  The anterior chamber was filled with viscoelastic.  A 2.4 millimeter keratome was used to make a near-clear corneal incision at the 2:00 position.  A curvilinear capsulorrhexis was made with a cystotome and capsulorrhexis forceps.  Balanced salt solution was used to hydrodissect and hydrodelineate the nucleus.   Phacoemulsification was then used in stop and chop fashion to remove the lens nucleus and epinucleus.  The remaining cortex was then removed using the irrigation and aspiration handpiece. Viscoelastic was  then placed into the capsular bag to distend it for lens placement.  A lens was then injected into the capsular bag.  The remaining viscoelastic was aspirated.   Wounds were hydrated with balanced salt solution.  The anterior chamber was inflated to a physiologic pressure with balanced salt solution.  Intracameral vigamox 0.1 mL undiltued was injected into the eye and a drop placed onto the ocular surface.  No wound leaks were noted.  The patient was taken to the recovery room in stable condition without complications of anesthesia or surgery  Benay Pillow 01/14/2023, 9:42 AM

## 2023-01-15 ENCOUNTER — Encounter: Payer: Self-pay | Admitting: Ophthalmology

## 2023-01-15 ENCOUNTER — Other Ambulatory Visit: Payer: Self-pay

## 2023-01-23 ENCOUNTER — Ambulatory Visit: Payer: Medicare PPO | Admitting: Dermatology

## 2023-01-24 NOTE — Discharge Instructions (Signed)

## 2023-01-28 ENCOUNTER — Other Ambulatory Visit: Payer: Self-pay

## 2023-01-28 ENCOUNTER — Ambulatory Visit: Payer: Medicare PPO | Admitting: General Practice

## 2023-01-28 ENCOUNTER — Encounter: Payer: Self-pay | Admitting: Ophthalmology

## 2023-01-28 ENCOUNTER — Ambulatory Visit
Admission: RE | Admit: 2023-01-28 | Discharge: 2023-01-28 | Disposition: A | Discharge status: Home / Self Care | Payer: Medicare PPO | Attending: Ophthalmology | Admitting: Ophthalmology

## 2023-01-28 ENCOUNTER — Encounter
Admission: RE | Disposition: A | Discharge status: Home / Self Care | Payer: Self-pay | Source: Home / Self Care | Attending: Ophthalmology

## 2023-01-28 DIAGNOSIS — Z8673 Personal history of transient ischemic attack (TIA), and cerebral infarction without residual deficits: Secondary | ICD-10-CM | POA: Insufficient documentation

## 2023-01-28 DIAGNOSIS — I10 Essential (primary) hypertension: Secondary | ICD-10-CM | POA: Insufficient documentation

## 2023-01-28 DIAGNOSIS — Z79899 Other long term (current) drug therapy: Secondary | ICD-10-CM | POA: Diagnosis not present

## 2023-01-28 DIAGNOSIS — K219 Gastro-esophageal reflux disease without esophagitis: Secondary | ICD-10-CM | POA: Diagnosis not present

## 2023-01-28 DIAGNOSIS — D649 Anemia, unspecified: Secondary | ICD-10-CM | POA: Insufficient documentation

## 2023-01-28 DIAGNOSIS — G4730 Sleep apnea, unspecified: Secondary | ICD-10-CM | POA: Diagnosis not present

## 2023-01-28 DIAGNOSIS — H2511 Age-related nuclear cataract, right eye: Secondary | ICD-10-CM | POA: Diagnosis present

## 2023-01-28 HISTORY — PX: CATARACT EXTRACTION W/PHACO: SHX586

## 2023-01-28 SURGERY — PHACOEMULSIFICATION, CATARACT, WITH IOL INSERTION
Anesthesia: Monitor Anesthesia Care | Site: Eye | Laterality: Right

## 2023-01-28 MED ORDER — ARMC OPHTHALMIC DILATING DROPS
1.0000 | OPHTHALMIC | Status: DC | PRN
  Administered 2023-01-28 (×3): 1 via OPHTHALMIC

## 2023-01-28 MED ORDER — MIDAZOLAM HCL 2 MG/2ML IJ SOLN
INTRAMUSCULAR | Status: DC | PRN
  Administered 2023-01-28: 2 mg via INTRAVENOUS

## 2023-01-28 MED ORDER — FENTANYL CITRATE (PF) 100 MCG/2ML IJ SOLN
INTRAMUSCULAR | Status: DC | PRN
  Administered 2023-01-28 (×2): 50 ug via INTRAVENOUS

## 2023-01-28 MED ORDER — MOXIFLOXACIN HCL 0.5 % OP SOLN
OPHTHALMIC | Status: DC | PRN
  Administered 2023-01-28: .2 mL via OPHTHALMIC

## 2023-01-28 MED ORDER — TETRACAINE HCL 0.5 % OP SOLN
1.0000 [drp] | OPHTHALMIC | Status: DC | PRN
  Administered 2023-01-28 (×3): 1 [drp] via OPHTHALMIC

## 2023-01-28 MED ORDER — SIGHTPATH DOSE#1 BSS IO SOLN
INTRAOCULAR | Status: DC | PRN
  Administered 2023-01-28: 15 mL

## 2023-01-28 MED ORDER — SIGHTPATH DOSE#1 NA HYALUR & NA CHOND-NA HYALUR IO KIT
PACK | INTRAOCULAR | Status: DC | PRN
  Administered 2023-01-28: 1 via OPHTHALMIC

## 2023-01-28 MED ORDER — LIDOCAINE HCL (PF) 2 % IJ SOLN
INTRAOCULAR | Status: DC | PRN
  Administered 2023-01-28: 1 mL via INTRAOCULAR

## 2023-01-28 MED ORDER — SIGHTPATH DOSE#1 BSS IO SOLN
INTRAOCULAR | Status: DC | PRN
  Administered 2023-01-28: 81 mL via OPHTHALMIC

## 2023-01-28 SURGICAL SUPPLY — 13 items
CATARACT SUITE SIGHTPATH (MISCELLANEOUS) ×1 IMPLANT
DISSECTOR HYDRO NUCLEUS 50X22 (MISCELLANEOUS) ×1 IMPLANT
FEE CATARACT SUITE SIGHTPATH (MISCELLANEOUS) ×1 IMPLANT
GLOVE SURG GAMMEX PI TX LF 7.5 (GLOVE) ×1 IMPLANT
GLOVE SURG SYN 8.5  E (GLOVE) ×1
GLOVE SURG SYN 8.5 E (GLOVE) ×1 IMPLANT
GLOVE SURG SYN 8.5 PF PI (GLOVE) ×1 IMPLANT
LENS IOL TECNIS EYHANCE 19.5 (Intraocular Lens) IMPLANT
NDL FILTER BLUNT 18X1 1/2 (NEEDLE) ×1 IMPLANT
NEEDLE FILTER BLUNT 18X1 1/2 (NEEDLE) ×1 IMPLANT
SYR 3ML LL SCALE MARK (SYRINGE) ×1 IMPLANT
SYR 5ML LL (SYRINGE) ×1 IMPLANT
WATER STERILE IRR 250ML POUR (IV SOLUTION) ×1 IMPLANT

## 2023-01-28 NOTE — Anesthesia Preprocedure Evaluation (Signed)
Anesthesia Evaluation  Patient identified by MRN, date of birth, ID band Patient awake    Reviewed: Allergy & Precautions, H&P , NPO status , Patient's Chart, lab work & pertinent test results, reviewed documented beta blocker date and time   Airway Mallampati: II  TM Distance: >3 FB Neck ROM: full    Dental no notable dental hx. (+) Teeth Intact   Pulmonary sleep apnea and Continuous Positive Airway Pressure Ventilation    Pulmonary exam normal breath sounds clear to auscultation       Cardiovascular Exercise Tolerance: Poor hypertension, On Medications  Rhythm:regular Rate:Normal     Neuro/Psych CVA, No Residual Symptoms  negative psych ROS   GI/Hepatic Neg liver ROS,GERD  Medicated,,  Endo/Other  negative endocrine ROSdiabetes, Well Controlled    Renal/GU      Musculoskeletal   Abdominal   Peds  Hematology  (+) Blood dyscrasia, anemia   Anesthesia Other Findings   Reproductive/Obstetrics negative OB ROS                             Anesthesia Physical Anesthesia Plan  ASA: 3  Anesthesia Plan: MAC   Post-op Pain Management:    Induction:   PONV Risk Score and Plan:   Airway Management Planned:   Additional Equipment:   Intra-op Plan:   Post-operative Plan:   Informed Consent: I have reviewed the patients History and Physical, chart, labs and discussed the procedure including the risks, benefits and alternatives for the proposed anesthesia with the patient or authorized representative who has indicated his/her understanding and acceptance.       Plan Discussed with: CRNA  Anesthesia Plan Comments:        Anesthesia Quick Evaluation

## 2023-01-28 NOTE — Op Note (Signed)
OPERATIVE NOTE  Ryan [name] 01/28/2023   PREOPERATIVE DIAGNOSIS:  Nuclear sclerotic cataract right eye.  H25.11   POSTOPERATIVE DIAGNOSIS:    Nuclear sclerotic cataract right eye.     PROCEDURE:  Phacoemusification with posterior chamber intraocular lens placement of the right eye   LENS:   Implant Name Type Inv. Item Serial No. Manufacturer Lot No. LRB No. Used Action  LENS IOL TECNIS EYHANCE 19.5 - [id number] Intraocular Lens LENS IOL TECNIS EYHANCE 19.5 [id number] [employer]  Right 1 Implanted       Procedure(s) with comments: CATARACT EXTRACTION PHACO AND INTRAOCULAR LENS PLACEMENT (IOC) RIGHT DIABETIC  3.20  00:30.4 (Right) - sleep apnea  DIB00 +19.5   ULTRASOUND TIME: 0 minutes 30 seconds.  CDE 3.20   SURGEON:  Benay Pillow, MD, MPH  ANESTHESIOLOGIST: Anesthesiologist: Iran Ouch, MD CRNA: Moises Blood, CRNA   ANESTHESIA:  Topical with tetracaine drops augmented with 1% preservative-free intracameral lidocaine.  ESTIMATED BLOOD LOSS: less than 1 mL.   COMPLICATIONS:  None.   DESCRIPTION OF PROCEDURE:  The patient was identified in the holding room and transported to the operating room and placed in the supine position under the operating microscope.  The right eye was identified as the operative eye and it was prepped and draped in the usual sterile ophthalmic fashion.   A 1.0 millimeter clear-corneal paracentesis was made at the 10:30 position. 0.5 ml of preservative-free 1% lidocaine with epinephrine was injected into the anterior chamber.  The anterior chamber was filled with viscoelastic.  A 2.4 millimeter keratome was used to make a near-clear corneal incision at the 8:00 position.  A curvilinear capsulorrhexis was made with a cystotome and capsulorrhexis forceps.  Balanced salt solution was used to hydrodissect and hydrodelineate the nucleus.   Phacoemulsification was then used in stop and chop fashion to remove the lens nucleus and  epinucleus.  The remaining cortex was then removed using the irrigation and aspiration handpiece. Viscoelastic was then placed into the capsular bag to distend it for lens placement.  A lens was then injected into the capsular bag.  The remaining viscoelastic was aspirated.   Wounds were hydrated with balanced salt solution.  The anterior chamber was inflated to a physiologic pressure with balanced salt solution.   Intracameral vigamox 0.1 mL undiluted was injected into the eye and a drop placed onto the ocular surface.  No wound leaks were noted.  The patient was taken to the recovery room in stable condition without complications of anesthesia or surgery  Benay Pillow 01/28/2023, 7:53 AM

## 2023-01-28 NOTE — Anesthesia Postprocedure Evaluation (Signed)
Anesthesia Post Note  Patient: Ryan [name]  Procedure(s) Performed: CATARACT EXTRACTION PHACO AND INTRAOCULAR LENS PLACEMENT (IOC) RIGHT DIABETIC  3.20  00:30.4 (Right: Eye)  Patient location during evaluation: PACU Anesthesia Type: MAC Level of consciousness: awake and alert Pain management: pain level controlled Vital Signs Assessment: post-procedure vital signs reviewed and stable Respiratory status: spontaneous breathing, nonlabored ventilation and respiratory function stable Cardiovascular status: blood pressure returned to baseline and stable Postop Assessment: no apparent nausea or vomiting Anesthetic complications: no   No notable events documented.   Last Vitals:  Vitals:   01/28/23 0755 01/28/23 0800  BP: (!) 158/76 (!) 142/88  Pulse: 63 69  Resp: 11 15  Temp: 36.7 C 36.7 C  SpO2: 97% 96%    Last Pain:  Vitals:   01/28/23 0800  TempSrc:   PainSc: 0-No pain                 Iran Ouch

## 2023-01-28 NOTE — H&P (Signed)
[facility]   Primary Care Physician:  Derinda Late, MD Ophthalmologist: Dr. Benay Pillow  Pre-Procedure History & Physical: HPI:  Ryan [name] is a 72 y.o. male here for cataract surgery.   Past Medical History:  Diagnosis Date   Anemia    Clotting disorder ([place])    DVT (deep venous thrombosis) ([place]) 2017   left leg   GERD (gastroesophageal reflux disease)    Hypertension    PAF (paroxysmal atrial fibrillation) ([place])    Sleep apnea    can't tolerate CPAP   Stroke ([place]) 01/16/2020   No deficits    Past Surgical History:  Procedure Laterality Date   APPENDECTOMY  2002   CATARACT EXTRACTION W/PHACO Left 01/14/2023   Procedure: CATARACT EXTRACTION PHACO AND INTRAOCULAR LENS PLACEMENT (IOC) LEFT DIABETIC  4.91  00:37.0;  Surgeon: Eulogio Bear, MD;  Location: [facility];  Service: Ophthalmology;  Laterality: Left;  sleep apnea   COLONOSCOPY  2006   2003,2007,2008,2020   [place]    Prior to Admission medications   Medication Sig Start Date End Date Taking? Authorizing Provider  Acetaminophen (TYLENOL ARTHRITIS PAIN PO) Take 650 mg by mouth 3 (three) times daily.   Yes [provider]  atorvastatin (LIPITOR) 40 MG tablet Take 1 tablet (40 mg total) by mouth at bedtime. 11/16/20  Yes Kathie Dike, MD  cetirizine (ZYRTEC) 10 MG tablet Take 10 mg by mouth at bedtime.   Yes [provider]  Cholecalciferol (VITAMIN D3) 2000 units capsule Take 2,000 Units by mouth daily.   Yes [provider]  diltiazem (DILACOR XR) 120 MG 24 hr capsule Take 120 mg by mouth daily.   Yes [provider]  ELIQUIS 5 MG TABS tablet Take 1 tablet (5 mg total) by mouth 2 (two) times daily. Resume on 11/19/2020 11/16/20  Yes Kathie Dike, MD  Ferrous Sulfate Dried 45 MG TBCR Take 1 tablet by mouth at bedtime.   Yes [provider]  gabapentin (NEURONTIN) 100 MG capsule Take 100 mg by mouth 3 (three) times daily. 07/11/20   Yes [provider]  omeprazole (PRILOSEC) 20 MG capsule Take 20 mg by mouth daily. 03/09/17  Yes [provider]  traMADol (ULTRAM) 50 MG tablet Take by mouth every 6 (six) hours as needed.   Yes [provider]  vitamin B-12 (CYANOCOBALAMIN) 1000 MCG tablet Take 1,000 mcg by mouth daily.   Yes [provider]    Allergies as of 12/18/2022 - Review Complete 11/24/2022  Allergen Reaction Noted   Amoxicillin-pot clavulanate Diarrhea 08/03/2014   Nitroglycerin Other (See Comments) 05/23/2017    Family History  Problem Relation Age of Onset   Cancer Maternal Aunt    Cancer Paternal Uncle     Social History   Socioeconomic History   Marital status: Married    Spouse name: Not on file   Number of children: Not on file   Years of education: Not on file   Highest education level: Not on file  Occupational History   Not on file  Tobacco Use   Smoking status: Never   Smokeless tobacco: Never  Vaping Use   Vaping Use: Never used  Substance and Sexual Activity   Alcohol use: Yes    Comment: rare   Drug use: Not on file   Sexual activity: Yes  Other Topics Concern   Not on file  Social History Narrative   Not on file   Social Determinants of  Health   Financial Resource Strain: Not on file  Food Insecurity: Not on file  Transportation Needs: Not on file  Physical Activity: Not on file  Stress: Not on file  Social Connections: Not on file  Intimate Partner Violence: Not on file    Review of Systems: See HPI, otherwise negative ROS  Physical Exam: BP (!) 142/77   Pulse 71   Temp 98.1 F (36.7 C) (Temporal)   Resp 16   Ht '5\' 7"'$  (1.702 m)   Wt 98.3 kg   SpO2 94%   BMI 33.94 kg/m  General:   Alert, cooperative in NAD Head:  Normocephalic and atraumatic. Respiratory:  Normal work of breathing. Cardiovascular:  RRR  Impression/Plan: Ryan [name] is here for cataract surgery.  Risks, benefits, limitations, and alternatives  regarding cataract surgery have been reviewed with the patient.  Questions have been answered.  All parties agreeable.   Benay Pillow, MD  01/28/2023, 7:08 AM

## 2023-01-28 NOTE — Transfer of Care (Signed)
Immediate Anesthesia Transfer of Care Note  Patient: Ryan [name]  Procedure(s) Performed: CATARACT EXTRACTION PHACO AND INTRAOCULAR LENS PLACEMENT (IOC) RIGHT DIABETIC  3.20  00:30.4 (Right: Eye)  Patient Location: PACU  Anesthesia Type: MAC  Level of Consciousness: awake, alert  and patient cooperative  Airway and Oxygen Therapy: Patient Spontanous Breathing and Patient connected to supplemental oxygen  Post-op Assessment: Post-op Vital signs reviewed, Patient's Cardiovascular Status Stable, Respiratory Function Stable, Patent Airway and No signs of Nausea or vomiting  Post-op Vital Signs: Reviewed and stable  Complications: No notable events documented.

## 2023-01-29 ENCOUNTER — Encounter: Payer: Self-pay | Admitting: Ophthalmology

## 2023-05-16 ENCOUNTER — Encounter: Payer: Self-pay | Admitting: Dermatology

## 2023-05-16 ENCOUNTER — Ambulatory Visit: Payer: Medicare PPO | Admitting: Dermatology

## 2023-05-16 VITALS — BP 132/71 | HR 69

## 2023-05-16 DIAGNOSIS — L578 Other skin changes due to chronic exposure to nonionizing radiation: Secondary | ICD-10-CM

## 2023-05-16 DIAGNOSIS — D229 Melanocytic nevi, unspecified: Secondary | ICD-10-CM | POA: Diagnosis not present

## 2023-05-16 DIAGNOSIS — L739 Follicular disorder, unspecified: Secondary | ICD-10-CM | POA: Diagnosis not present

## 2023-05-16 DIAGNOSIS — L814 Other melanin hyperpigmentation: Secondary | ICD-10-CM

## 2023-05-16 DIAGNOSIS — K130 Diseases of lips: Secondary | ICD-10-CM

## 2023-05-16 DIAGNOSIS — Z79899 Other long term (current) drug therapy: Secondary | ICD-10-CM

## 2023-05-16 DIAGNOSIS — Z1283 Encounter for screening for malignant neoplasm of skin: Secondary | ICD-10-CM

## 2023-05-16 DIAGNOSIS — Z7189 Other specified counseling: Secondary | ICD-10-CM

## 2023-05-16 DIAGNOSIS — L817 Pigmented purpuric dermatosis: Secondary | ICD-10-CM

## 2023-05-16 DIAGNOSIS — I872 Venous insufficiency (chronic) (peripheral): Secondary | ICD-10-CM

## 2023-05-16 MED ORDER — MUPIROCIN 2 % EX OINT: TOPICAL_OINTMENT | CUTANEOUS | 1 refills | Status: AC

## 2023-05-16 NOTE — Progress Notes (Deleted)
   Follow-Up Visit   Subjective  Ryan [name] is a 72 y.o. male who presents for the following: Warts. Left thumb subungual and left heel. 5 month follow up. Tx with LN2, cantharidin plus, squaric acid and candida albicans antigen injection. Has not used WartPeel in 6-8 weeks thinks warts have improved or maybe resolved.     The following portions of the chart were reviewed this encounter and updated as appropriate: medications, allergies, medical history  Review of Systems:  No other skin or systemic complaints except as noted in HPI or Assessment and Plan.  Objective  Well appearing patient in no apparent distress; mood and affect are within normal limits.  Areas Examined: ***  Relevant physical exam findings are noted in the Assessment and Plan.    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  Multiple treatments and methods may be required to clear warts and it is possible treatment may not be successful.  Treatment risks include discoloration; scarring and there is still potential for wart recurrence.  Treatment Plan: ***  No follow-ups on file.  ***  Documentation: I have reviewed the above documentation for accuracy and completeness, and I agree with the above.  Armida Sans, MD

## 2023-05-16 NOTE — Patient Instructions (Addendum)
Nose: Start Mupirocin ointment twice daily to nose for 7 days.   Use OTC HC 1% cream twice daily up to 1 week as needed at corners of mouth.    Recommend daily broad spectrum sunscreen SPF 30+ to sun-exposed areas, reapply every 2 hours as needed. Call for new or changing lesions.  Staying in the shade or wearing long sleeves, sun glasses (UVA+UVB protection) and wide brim hats (4-inch brim around the entire circumference of the hat) are also recommended for sun protection.    Melanoma ABCDEs  Melanoma is the most dangerous type of skin cancer, and is the leading cause of death from skin disease.  You are more likely to develop melanoma if you: Have light-colored skin, light-colored eyes, or red or blond hair Spend a lot of time in the sun Tan regularly, either outdoors or in a tanning bed Have had blistering sunburns, especially during childhood Have a close family member who has had a melanoma Have atypical moles or large birthmarks  Early detection of melanoma is key since treatment is typically straightforward and cure rates are extremely high if we catch it early.   The first sign of melanoma is often a change in a mole or a new dark spot.  The ABCDE system is a way of remembering the signs of melanoma.  A for asymmetry:  The two halves do not match. B for border:  The edges of the growth are irregular. C for color:  A mixture of colors are present instead of an even brown color. D for diameter:  Melanomas are usually (but not always) greater than 6mm - the size of a pencil eraser. E for evolution:  The spot keeps changing in size, shape, and color.  Please check your skin once per month between visits. You can use a small mirror in front and a large mirror behind you to keep an eye on the back side or your body.   If you see any new or changing lesions before your next follow-up, please call to schedule a visit.  Please continue daily skin protection including broad spectrum  sunscreen SPF 30+ to sun-exposed areas, reapplying every 2 hours as needed when you're outdoors.   Staying in the shade or wearing long sleeves, sun glasses (UVA+UVB protection) and wide brim hats (4-inch brim around the entire circumference of the hat) are also recommended for sun protection.    Due to recent changes in healthcare laws, you may see results of your pathology and/or laboratory studies on MyChart before the doctors have had a chance to review them. We understand that in some cases there may be results that are confusing or concerning to you. Please understand that not all results are received at the same time and often the doctors may need to interpret multiple results in order to provide you with the best plan of care or course of treatment. Therefore, we ask that you please give [place] 2 business days to thoroughly review all your results before contacting the office for clarification. Should we see a critical lab result, you will be contacted sooner.   If You Need Anything After Your Visit  If you have any questions or concerns for your doctor, please call our main line at [phone] and press option 4 to reach your doctor's medical assistant. If no one answers, please leave a voicemail as directed and we will return your call as soon as possible. Messages left after 4 pm will be answered the following business  day.   You may also send [place] a message via MyChart. We typically respond to MyChart messages within 1-2 business days.  For prescription refills, please ask your pharmacy to contact our office. Our fax number is [phone].  If you have an urgent issue when the clinic is closed that cannot wait until the next business day, you can page your doctor at the number below.    Please note that while we do our best to be available for urgent issues outside of office hours, we are not available 24/7.   If you have an urgent issue and are unable to reach [place], you may choose to seek  medical care at your doctor's office, retail clinic, urgent care center, or emergency room.  If you have a medical emergency, please immediately call 911 or go to the emergency department.  Pager Numbers  - Dr. Gwen Pounds: [phone]  - Dr. Neale Burly: [phone]  - Dr. Roseanne Reno: [phone]  In the event of inclement weather, please call our main line at [phone] for an update on the status of any delays or closures.  Dermatology Medication Tips: Please keep the boxes that topical medications come in in order to help keep track of the instructions about where and how to use these. Pharmacies typically print the medication instructions only on the boxes and not directly on the medication tubes.   If your medication is too expensive, please contact our office at [phone] option 4 or send [place] a message through MyChart.   We are unable to tell what your co-pay for medications will be in advance as this is different depending on your insurance coverage. However, we may be able to find a substitute medication at lower cost or fill out paperwork to get insurance to cover a needed medication.   If a prior authorization is required to get your medication covered by your insurance company, please allow [place] 1-2 business days to complete this process.  Drug prices often vary depending on where the prescription is filled and some pharmacies may offer cheaper prices.  The website www.goodrx.com contains coupons for medications through different pharmacies. The prices here do not account for what the cost may be with help from insurance (it may be cheaper with your insurance), but the website can give you the price if you did not use any insurance.  - You can print the associated coupon and take it with your prescription to the pharmacy.  - You may also stop by our office during regular business hours and pick up a GoodRx coupon card.  - If you need your prescription sent electronically to a  different pharmacy, notify our office through [facility] or by phone at [phone] option 4.     Si Usted Necesita Algo Despus de Su Visita  Tambin puede enviarnos un mensaje a travs de Clinical cytogeneticist. Por lo general respondemos a los mensajes de MyChart en el transcurso de 1 a 2 das hbiles.  Para renovar recetas, por favor pida a su farmacia que se ponga en contacto con nuestra oficina. Annie Sable de fax es [place] [phone].  Si tiene un asunto urgente cuando la clnica est cerrada y que no puede esperar hasta el siguiente da hbil, puede llamar/localizar a su doctor(a) al nmero que aparece a continuacin.   Por favor, tenga en cuenta que aunque hacemos todo lo posible para estar disponibles para asuntos urgentes fuera del horario de [place], no estamos disponibles las 24 horas del da, los [address] de  la semana.   Si tiene un problema urgente y no puede comunicarse con nosotros, puede optar por buscar atencin mdica  en el consultorio de su doctor(a), en una clnica privada, en un centro de atencin urgente o en una sala de emergencias.  Si tiene Engineer, drilling, por favor llame inmediatamente al 911 o vaya a la sala de emergencias.  Nmeros de bper  - Dr. Gwen Pounds: [phone]  - Dra. Moye: [phone]  - Dra. Roseanne Reno: [phone]  En caso de inclemencias del [place], por favor llame a Lacy Duverney principal al [phone] para una actualizacin sobre el [place] de cualquier retraso o cierre.  Consejos para la medicacin en dermatologa: Por favor, guarde las cajas en las que vienen los medicamentos de uso tpico para ayudarle a seguir las instrucciones sobre dnde y cmo usarlos. Las farmacias generalmente imprimen las instrucciones del medicamento slo en las cajas y no directamente en los tubos del [place].   Si su medicamento es muy caro, por favor, pngase en contacto con Rolm Gala llamando al [phone] y presione la opcin 4 o envenos un  mensaje a travs de Clinical cytogeneticist.   No podemos decirle cul ser su copago por los medicamentos por adelantado ya que esto es diferente dependiendo de la cobertura de su seguro. Sin embargo, es posible que podamos encontrar un medicamento sustituto a Audiological scientist un formulario para que el seguro cubra el medicamento que se considera necesario.   Si se requiere una autorizacin previa para que su compaa de seguros [place] su medicamento, por favor permtanos de 1 a 2 das hbiles para completar [address]  Los precios de los medicamentos varan con frecuencia dependiendo del Environmental consultant de dnde se surte la receta y alguna farmacias pueden ofrecer precios ms baratos.  El sitio web www.goodrx.com tiene cupones para medicamentos de Health and safety inspector. Los precios aqu no tienen en cuenta lo que podra costar con la ayuda del seguro (puede ser ms barato con su seguro), pero el sitio web puede darle el precio si no utiliz Tourist information centre manager.  - Puede imprimir el cupn correspondiente y llevarlo con su receta a la farmacia.  - Tambin puede pasar por nuestra oficina durante el horario de atencin regular y Education officer, museum una tarjeta de cupones de GoodRx.  - Si necesita que su receta se enve electrnicamente a una farmacia diferente, informe a nuestra oficina a travs de MyChart de [place] o por telfono llamando al [phone] y presione la opcin 4.

## 2023-05-16 NOTE — Progress Notes (Signed)
Follow-Up Visit   Subjective  Ryan [name] is a 72 y.o. male who presents for the following: Skin Cancer Screening and Full Body Skin Exam. No personal Hx of skin cancer or dysplastic nevi. Hx of Aks. Recheck warts. Left thumb subungual and left heel. 5 month follow up. Tx with LN2, cantharidin plus, squaric acid and candida albicans antigen injection. Has not used WartPeel in 6-8 weeks thinks warts have improved or maybe resolved.  The patient presents for Total-Body Skin Exam (TBSE) for skin cancer screening and mole check. The patient has spots, moles and lesions to be evaluated, some may be new or changing and the patient has concerns that these could be cancer.  The following portions of the chart were reviewed this encounter and updated as appropriate: medications, allergies, medical history  Review of Systems:  No other skin or systemic complaints except as noted in HPI or Assessment and Plan.  Objective  Well appearing patient in no apparent distress; mood and affect are within normal limits.  A full examination was performed including scalp, head, eyes, ears, nose, lips, neck, chest, axillae, abdomen, back, buttocks, bilateral upper extremities, bilateral lower extremities, hands, feet, fingers, toes, fingernails, and toenails. All findings within normal limits unless otherwise noted below.   Relevant physical exam findings are noted in the Assessment and Plan.   Assessment & Plan   LENTIGINES, SEBORRHEIC KERATOSES, HEMANGIOMAS - Benign normal skin lesions - Benign-appearing - Call for any changes  MELANOCYTIC NEVI - Tan-brown and/or pink-flesh-colored symmetric macules and papules - Benign appearing on exam today - Observation - Call clinic for new or changing moles - Recommend daily use of broad spectrum spf 30+ sunscreen to sun-exposed areas.   ACTINIC DAMAGE - Chronic condition, secondary to cumulative UV/sun exposure - diffuse scaly erythematous macules with  underlying dyspigmentation - Recommend daily broad spectrum sunscreen SPF 30+ to sun-exposed areas, reapply every 2 hours as needed.  - Staying in the shade or wearing long sleeves, sun glasses (UVA+UVB protection) and wide brim hats (4-inch brim around the entire circumference of the hat) are also recommended for sun protection.  - Call for new or changing lesions.  SKIN CANCER SCREENING PERFORMED TODAY.  FOLLICULITIS Exam: Perifollicular erythematous papules and pustules inside nose - resolved today. Treatment Plan: Start Mupirocin ointment twice daily to nose for 7 days.for recurrence  ANGULAR CHEILITIS Exam Scaly erythematous patches at corners of lips Treatment Plan Use OTC HC 1% cream twice daily up to 1 week as needed at corners of mouth.  Can add topical antiyeast medications in the future if necessary such as ketoconazole cream Discussed hyaluronic acid fillers for the oral commissure/marionette line areas Patient deferred Rx treatment today.   STASIS DERMATITIS with schamberg's purpura Exam: Erythematous, scaly patches involving the ankle and distal lower leg with associated lower leg edema. Chronic and persistent condition with duration or expected duration over one year. Condition is symptomatic/ bothersome to patient. Not currently at goal. Stasis in the legs causes chronic leg swelling, which may result in itchy or painful rashes, skin discoloration, skin texture changes, and sometimes ulceration.  Recommend daily graduated compression hose/stockings- easiest to put on first thing in morning, remove at bedtime.  Elevate legs as much as possible. Avoid salt/sodium rich foods. Treatment Plan: Patient deferred treatment at this time.  WARTS, Resolved Exam: normal appearing skin today at left thumb and left heel. Counseling Discussed viral / HPV (Human Papilloma Virus) etiology and risk of spread /infectivity to other areas  of body as well as to other people.  Multiple  treatments and methods may be required to clear warts and it is possible treatment may not be successful.  Treatment risks include discoloration; scarring and there is still potential for wart recurrence. Treatment Plan: No treatment needed today.    Return in about 1 year (around 05/15/2024) for TBSE.  I, Lawson Radar, CMA, am acting as scribe for Armida Sans, MD.  Documentation: I have reviewed the above documentation for accuracy and completeness, and I agree with the above.  Armida Sans, MD

## 2023-05-18 ENCOUNTER — Encounter: Payer: Self-pay | Admitting: Dermatology

## 2024-02-16 ENCOUNTER — Other Ambulatory Visit: Payer: Self-pay

## 2024-02-16 ENCOUNTER — Emergency Department
Admission: EM | Admit: 2024-02-16 | Discharge: 2024-02-16 | Disposition: A | Discharge status: Home / Self Care | Attending: Emergency Medicine | Admitting: Emergency Medicine

## 2024-02-16 DIAGNOSIS — R300 Dysuria: Secondary | ICD-10-CM | POA: Diagnosis present

## 2024-02-16 DIAGNOSIS — I10 Essential (primary) hypertension: Secondary | ICD-10-CM | POA: Insufficient documentation

## 2024-02-16 DIAGNOSIS — N390 Urinary tract infection, site not specified: Secondary | ICD-10-CM | POA: Diagnosis not present

## 2024-02-16 DIAGNOSIS — Z7901 Long term (current) use of anticoagulants: Secondary | ICD-10-CM | POA: Insufficient documentation

## 2024-02-16 DIAGNOSIS — D72829 Elevated white blood cell count, unspecified: Secondary | ICD-10-CM | POA: Diagnosis not present

## 2024-02-16 LAB — URINALYSIS, W/ REFLEX TO CULTURE (INFECTION SUSPECTED)
Bilirubin Urine: NEGATIVE
Glucose, UA: 150 mg/dL — AB
Ketones, ur: NEGATIVE mg/dL
Nitrite: NEGATIVE
Protein, ur: 30 mg/dL — AB
Specific Gravity, Urine: 1.024 (ref 1.005–1.030)
WBC, UA: >50 WBC/hpf (ref 0–5)
pH: 5 (ref 5.0–8.0)

## 2024-02-16 LAB — CBC WITH DIFFERENTIAL/PLATELET
Abs Immature Granulocytes: 0.1 10*3/uL — ABNORMAL HIGH (ref 0.00–0.07)
Basophils Absolute: 0.1 10*3/uL (ref 0.0–0.1)
Basophils Relative: 0 %
Eosinophils Absolute: 0 10*3/uL (ref 0.0–0.5)
Eosinophils Relative: 0 %
HCT: 46.3 % (ref 39.0–52.0)
Hemoglobin: 15.5 g/dL (ref 13.0–17.0)
Immature Granulocytes: 1 %
Lymphocytes Relative: 4 %
Lymphs Abs: 0.7 10*3/uL (ref 0.7–4.0)
MCH: 30.6 pg (ref 26.0–34.0)
MCHC: 33.5 g/dL (ref 30.0–36.0)
MCV: 91.3 fL (ref 80.0–100.0)
Monocytes Absolute: 1.1 10*3/uL — ABNORMAL HIGH (ref 0.1–1.0)
Monocytes Relative: 6 %
Neutro Abs: 16.5 10*3/uL — ABNORMAL HIGH (ref 1.7–7.7)
Neutrophils Relative %: 89 %
Platelets: 172 10*3/uL (ref 150–400)
RBC: 5.07 MIL/uL (ref 4.22–5.81)
RDW: 13.3 % (ref 11.5–15.5)
WBC: 18.5 10*3/uL — ABNORMAL HIGH (ref 4.0–10.5)
nRBC: 0 % (ref 0.0–0.2)

## 2024-02-16 LAB — TROPONIN I (HIGH SENSITIVITY): Troponin I (High Sensitivity): 10 ng/L (ref <18)

## 2024-02-16 LAB — COMPREHENSIVE METABOLIC PANEL WITH GFR
ALT: 19 U/L (ref 0–44)
AST: 21 U/L (ref 15–41)
Albumin: 3.5 g/dL (ref 3.5–5.0)
Alkaline Phosphatase: 43 U/L (ref 38–126)
Anion gap: 9 (ref 5–15)
BUN: 13 mg/dL (ref 8–23)
CO2: 26 mmol/L (ref 22–32)
Calcium: 8.7 mg/dL — ABNORMAL LOW (ref 8.9–10.3)
Chloride: 101 mmol/L (ref 98–111)
Creatinine, Ser: 1.01 mg/dL (ref 0.61–1.24)
GFR, Estimated: >60 mL/min (ref >60)
Glucose, Bld: 168 mg/dL — ABNORMAL HIGH (ref 70–99)
Potassium: 4 mmol/L (ref 3.5–5.1)
Sodium: 136 mmol/L (ref 135–145)
Total Bilirubin: 0.8 mg/dL (ref 0.0–1.2)
Total Protein: 7.2 g/dL (ref 6.5–8.1)

## 2024-02-16 MED ORDER — SODIUM CHLORIDE 0.9 % IV SOLN
2.0000 g | Freq: Once | INTRAVENOUS | Status: AC
  Administered 2024-02-16: 2 g via INTRAVENOUS
  Filled 2024-02-16: qty 20

## 2024-02-16 MED ORDER — ONDANSETRON HCL 4 MG/2ML IJ SOLN
4.0000 mg | Freq: Once | INTRAMUSCULAR | Status: AC
  Administered 2024-02-16: 4 mg via INTRAVENOUS
  Filled 2024-02-16: qty 2

## 2024-02-16 MED ORDER — PHENAZOPYRIDINE HCL 95 MG PO TABS: 95.0000 mg | ORAL_TABLET | Freq: Three times a day (TID) | ORAL | 0 refills | Status: AC | PRN

## 2024-02-16 MED ORDER — ONDANSETRON 4 MG PO TBDP: 4.0000 mg | ORAL_TABLET | Freq: Four times a day (QID) | ORAL | 0 refills | Status: AC | PRN

## 2024-02-16 MED ORDER — CEPHALEXIN 500 MG PO CAPS: 500.0000 mg | ORAL_CAPSULE | Freq: Two times a day (BID) | ORAL | 0 refills | Status: AC

## 2024-02-16 MED ORDER — PHENAZOPYRIDINE HCL 100 MG PO TABS
95.0000 mg | ORAL_TABLET | Freq: Once | ORAL | Status: AC
  Administered 2024-02-16: 100 mg via ORAL
  Filled 2024-02-16: qty 1

## 2024-02-16 MED ORDER — SODIUM CHLORIDE 0.9 % IV BOLUS (SEPSIS)
1000.0000 mL | Freq: Once | INTRAVENOUS | Status: AC
  Administered 2024-02-16: 1000 mL via INTRAVENOUS

## 2024-02-16 NOTE — ED Triage Notes (Signed)
 BIB ems from home  Per pt "difficulty urinating for past couple months, today it has been painful and frequent, I felt very nauseated and like I might pass out."  Denies loc, no falls  Pt a&ox4, nadn

## 2024-02-16 NOTE — Discharge Instructions (Signed)
 Please increase your fluid intake.  You may use Tylenol 1000 mg every 6 hours as needed for pain, fever.

## 2024-02-16 NOTE — ED Provider Notes (Signed)
 [facility] Provider Note    Event Date/Time   First MD Initiated Contact with Patient 02/16/24 [phone]     (approximate)   History   Dysuria   HPI  Ryan [name] is a 73 y.o. male with history of paroxysmal atrial fibrillation, hypertension, prior DVT on Eliquis, hyperlipidemia who presents to the emergency department with complaints of dysuria, urinary frequency and urgency that started tonight.  States that he started feeling very nauseated and was sitting on the toilet and thought that he was going to pass out.  He felt lightheaded.  Denies any chest pain or chest discomfort, shortness of breath.  No fever.  No abdominal pain.  Wife states she thinks he is dehydrated because she states he does not eat or drink well.   History provided by patient, wife, EMS.    Past Medical History:  Diagnosis Date   Anemia    Clotting disorder (HCC)    DVT (deep venous thrombosis) (HCC) 2017   left leg   GERD (gastroesophageal reflux disease)    Hypertension    PAF (paroxysmal atrial fibrillation) (HCC)    Sleep apnea    can't tolerate CPAP   Stroke (HCC) 01/16/2020   No deficits    Past Surgical History:  Procedure Laterality Date   APPENDECTOMY  2002   CATARACT EXTRACTION W/PHACO Left 01/14/2023   Procedure: CATARACT EXTRACTION PHACO AND INTRAOCULAR LENS PLACEMENT (IOC) LEFT DIABETIC  4.91  00:37.0;  Surgeon: Nevada Crane, MD;  Location: [facility];  Service: Ophthalmology;  Laterality: Left;  sleep apnea   CATARACT EXTRACTION W/PHACO Right 01/28/2023   Procedure: CATARACT EXTRACTION PHACO AND INTRAOCULAR LENS PLACEMENT (IOC) RIGHT DIABETIC  3.20  00:30.4;  Surgeon: Nevada Crane, MD;  Location: [facility];  Service: Ophthalmology;  Laterality: Right;  sleep apnea   COLONOSCOPY  2006   2003,2007,2008,2020   KNEE SURGERY  1986    MEDICATIONS:  Prior to Admission medications   Medication Sig Start Date End Date Taking?  Authorizing Provider  Acetaminophen (TYLENOL ARTHRITIS PAIN PO) Take 650 mg by mouth 3 (three) times daily.    [provider]  atorvastatin (LIPITOR) 40 MG tablet Take 1 tablet (40 mg total) by mouth at bedtime. 11/16/20   Erick Blinks, MD  cetirizine (ZYRTEC) 10 MG tablet Take 10 mg by mouth at bedtime.    [provider]  Cholecalciferol (VITAMIN D3) 2000 units capsule Take 2,000 Units by mouth daily.    [provider]  diltiazem (DILACOR XR) 120 MG 24 hr capsule Take 120 mg by mouth daily.    [provider]  ELIQUIS 5 MG TABS tablet Take 1 tablet (5 mg total) by mouth 2 (two) times daily. Resume on 11/19/2020 11/16/20   Erick Blinks, MD  Ferrous Sulfate Dried 45 MG TBCR Take 1 tablet by mouth at bedtime.    [provider]  gabapentin (NEURONTIN) 100 MG capsule Take 100 mg by mouth 3 (three) times daily. 07/11/20   [provider]  mupirocin ointment (BACTROBAN) 2 % Apply twice daily to inside of nose for 7 days 05/16/23   Deirdre Evener, MD  omeprazole (PRILOSEC) 20 MG capsule Take 20 mg by mouth daily. 03/09/17   [provider]  traMADol (ULTRAM) 50 MG tablet Take by mouth every 6 (six) hours as needed.    [provider]  vitamin B-12 (CYANOCOBALAMIN) 1000 MCG tablet Take 1,000 mcg by mouth daily.  [provider]    Physical Exam   Triage Vital Signs: ED Triage Vitals  Encounter Vitals Group     BP 02/16/24 0502 136/64     Systolic BP Percentile --      Diastolic BP Percentile --      Pulse Rate 02/16/24 0502 82     Resp 02/16/24 0502 17     Temp 02/16/24 0502 98.6 F (37 C)     Temp Source 02/16/24 0502 Oral     SpO2 02/16/24 0502 97 %     Weight 02/16/24 0501 220 lb (99.8 kg)     Height 02/16/24 0501 5\' 7"  (1.702 m)     Head Circumference --      Peak Flow --      Pain Score --      Pain Loc --      Pain Education --      Exclude from Growth Chart --     Most recent vital  signs: Vitals:   02/16/24 0502  BP: 136/64  Pulse: 82  Resp: 17  Temp: 98.6 F (37 C)  SpO2: 97%    CONSTITUTIONAL: Alert, responds appropriately to questions. Well-appearing; well-nourished, elderly, pleasant, in no distress HEAD: Normocephalic, atraumatic EYES: Conjunctivae clear, pupils appear equal, sclera nonicteric ENT: normal nose; moist mucous membranes NECK: Supple, normal ROM CARD: RRR; S1 and S2 appreciated RESP: Normal chest excursion without splinting or tachypnea; breath sounds clear and equal bilaterally; no wheezes, no rhonchi, no rales, no hypoxia or respiratory distress, speaking full sentences ABD/GI: Non-distended; soft, non-tender, no rebound, no guarding, no peritoneal signs BACK: The back appears normal EXT: Normal ROM in all joints; no deformity noted, no edema SKIN: Normal color for age and race; warm; no rash on exposed skin NEURO: Moves all extremities equally, normal speech PSYCH: The patient's mood and manner are appropriate.   ED Results / Procedures / Treatments   LABS: (all labs ordered are listed, but only abnormal results are displayed) Labs Reviewed  URINALYSIS, W/ REFLEX TO CULTURE (INFECTION SUSPECTED) - Abnormal; Notable for the following components:      Result Value   Color, Urine YELLOW (*)    APPearance CLOUDY (*)    Glucose, UA 150 (*)    Hgb urine dipstick SMALL (*)    Protein, ur 30 (*)    Leukocytes,Ua LARGE (*)    Bacteria, UA RARE (*)    All other components within normal limits  CBC WITH DIFFERENTIAL/PLATELET - Abnormal; Notable for the following components:   WBC 18.5 (*)    Neutro Abs 16.5 (*)    Monocytes Absolute 1.1 (*)    Abs Immature Granulocytes 0.10 (*)    All other components within normal limits  COMPREHENSIVE METABOLIC PANEL WITH GFR - Abnormal; Notable for the following components:   Glucose, Bld 168 (*)    Calcium 8.7 (*)    All other components within normal limits  URINE CULTURE  TROPONIN I (HIGH  SENSITIVITY)     EKG:  EKG Interpretation Date/Time:  Sunday February 16 2024 05:37:25 EDT Ventricular Rate:  82 PR Interval:  164 QRS Duration:  82 QT Interval:  336 QTC Calculation: 392 R Axis:   18  Text Interpretation: Sinus rhythm with Premature supraventricular complexes Otherwise normal ECG When compared with ECG of 14-Nov-2020 17:51, PREVIOUS ECG IS PRESENT Confirmed by Rochele Raring [phone]) on 02/16/2024 5:39:27 AM         RADIOLOGY: My personal review and interpretation of  imaging:    I have personally reviewed all radiology reports.   No results found.   PROCEDURES:  Critical Care performed: No      .1-3 Lead EKG Interpretation  Performed by: Lebert Lovern, Layla Maw, DO Authorized by: Makaylyn Sinyard, Layla Maw, DO     Interpretation: normal     ECG rate:  82   ECG rate assessment: normal     Rhythm: sinus rhythm     Ectopy: none     Conduction: normal       IMPRESSION / MDM / ASSESSMENT AND PLAN / ED COURSE  I reviewed the triage vital signs and the nursing notes.    Patient here with dysuria, urinary frequency and urgency, nausea and lightheadedness.  The patient is on the cardiac monitor to evaluate for evidence of arrhythmia and/or significant heart rate changes.   DIFFERENTIAL DIAGNOSIS (includes but not limited to):   UTI, dehydration, doubt urinary retention, doubt kidney stone or pyelonephritis, low suspicion for appendicitis, low suspicion for ACS, arrhythmia, anemia, electrolyte derangement   Patient's presentation is most consistent with acute presentation with potential threat to life or bodily function.   PLAN: Will obtain labs, EKG.  Will give IV fluids, Zofran for symptomatic relief.  Will obtain urinalysis, urine culture.  No sign of urinary retention on exam.  He is able to urinate without difficulty but states that it is uncomfortable.   MEDICATIONS GIVEN IN ED: Medications  cefTRIAXone (ROCEPHIN) 2 g in sodium chloride 0.9 % 100 mL IVPB (2  g Intravenous New Bag/Given 02/16/24 0623)  phenazopyridine (PYRIDIUM) tablet 100 mg (has no administration in time range)  sodium chloride 0.9 % bolus 1,000 mL (1,000 mLs Intravenous New Bag/Given 02/16/24 0556)  ondansetron (ZOFRAN) injection 4 mg (4 mg Intravenous Given 02/16/24 0556)     ED COURSE: Patient's labs show leukocytosis of 18,000 consistent with infection.  He does have a UTI.  Culture pending.  Given Rocephin here and will discharge on Keflex.  No other signs or symptoms of sepsis today.  He is well-appearing, tolerating p.o.  Troponin negative.  Normal electrolytes.  Recommended Tylenol, Azo as needed.  Recommended increase fluid intake.  Patient and wife comfortable with this plan.  Bladder scan shows 0 mL after voiding.  At this time, I do not feel there is any life-threatening condition present. I reviewed all nursing notes, vitals, pertinent previous records.  All lab and urine results, EKGs, imaging ordered have been independently reviewed and interpreted by myself.  I reviewed all available radiology reports from any imaging ordered this visit.  Based on my assessment, I feel the patient is safe to be discharged home without further emergent workup and can continue workup as an outpatient as needed. Discussed all findings, treatment plan as well as usual and customary return precautions.  They verbalize understanding and are comfortable with this plan.  Outpatient follow-up has been provided as needed.  All questions have been answered.    CONSULTS:  none   OUTSIDE RECORDS REVIEWED: Reviewed last cardiology note in October 2024.       FINAL CLINICAL IMPRESSION(S) / ED DIAGNOSES   Final diagnoses:  Acute UTI     Rx / DC Orders   ED Discharge Orders          Ordered    phenazopyridine (PYRIDIUM) 95 MG tablet  3 times daily PRN        02/16/24 0650    cephALEXin (KEFLEX) 500 MG capsule  2 times daily  02/16/24 0650    ondansetron (ZOFRAN-ODT) 4 MG  disintegrating tablet  Every 6 hours PRN        02/16/24 1610             Note:  This document was prepared using Dragon voice recognition software and may include unintentional dictation errors.   Thekla Colborn, Layla Maw, [place] 02/16/24 [phone]

## 2024-02-18 LAB — URINE CULTURE: Culture: >=100000 — AB

## 2024-05-28 ENCOUNTER — Ambulatory Visit: Payer: Medicare PPO | Admitting: Dermatology

## 2024-06-02 ENCOUNTER — Encounter: Payer: Self-pay | Admitting: Dermatology

## 2024-06-02 ENCOUNTER — Ambulatory Visit: Admitting: Dermatology

## 2024-06-02 DIAGNOSIS — L814 Other melanin hyperpigmentation: Secondary | ICD-10-CM

## 2024-06-02 DIAGNOSIS — L578 Other skin changes due to chronic exposure to nonionizing radiation: Secondary | ICD-10-CM

## 2024-06-02 DIAGNOSIS — Z1283 Encounter for screening for malignant neoplasm of skin: Secondary | ICD-10-CM | POA: Diagnosis not present

## 2024-06-02 DIAGNOSIS — I872 Venous insufficiency (chronic) (peripheral): Secondary | ICD-10-CM

## 2024-06-02 DIAGNOSIS — D1801 Hemangioma of skin and subcutaneous tissue: Secondary | ICD-10-CM

## 2024-06-02 DIAGNOSIS — D229 Melanocytic nevi, unspecified: Secondary | ICD-10-CM

## 2024-06-02 DIAGNOSIS — L820 Inflamed seborrheic keratosis: Secondary | ICD-10-CM | POA: Diagnosis not present

## 2024-06-02 DIAGNOSIS — W908XXA Exposure to other nonionizing radiation, initial encounter: Secondary | ICD-10-CM

## 2024-06-02 DIAGNOSIS — Z7189 Other specified counseling: Secondary | ICD-10-CM

## 2024-06-02 DIAGNOSIS — L817 Pigmented purpuric dermatosis: Secondary | ICD-10-CM

## 2024-06-02 DIAGNOSIS — L821 Other seborrheic keratosis: Secondary | ICD-10-CM

## 2024-06-02 NOTE — Progress Notes (Signed)
 Follow-Up Visit   Subjective  Ryan [name] is a 73 y.o. male who presents for the following: Skin Cancer Screening and Full Body Skin Exam, check growth L forearm, 48m, scratches at, check spot L lower leg, ~70m, check mole L cheek, nicks when shaving, hx of warts L thumb, plantar feet, pt would like those areas checked   The patient presents for Total-Body Skin Exam (TBSE) for skin cancer screening and mole check. The patient has spots, moles and lesions to be evaluated, some may be new or changing and the patient may have concern these could be cancer.  The following portions of the chart were reviewed this encounter and updated as appropriate: medications, allergies, medical history  Review of Systems:  No other skin or systemic complaints except as noted in HPI or Assessment and Plan.  Objective  Well appearing patient in no apparent distress; mood and affect are within normal limits.  A full examination was performed including scalp, head, eyes, ears, nose, lips, neck, chest, axillae, abdomen, back, buttocks, bilateral upper extremities, bilateral lower extremities, hands, feet, fingers, toes, fingernails, and toenails. All findings within normal limits unless otherwise noted below.   Relevant physical exam findings are noted in the Assessment and Plan.  L cheek x 1, L forearm x 1, L lower leg x 1 (3) Stuck on waxy paps with erythema  Assessment & Plan   SKIN CANCER SCREENING PERFORMED TODAY.  ACTINIC DAMAGE - Chronic condition, secondary to cumulative UV/sun exposure - diffuse scaly erythematous macules with underlying dyspigmentation - Recommend daily broad spectrum sunscreen SPF 30+ to sun-exposed areas, reapply every 2 hours as needed.  - Staying in the shade or wearing long sleeves, sun glasses (UVA+UVB protection) and wide brim hats (4-inch brim around the entire circumference of the hat) are also recommended for sun protection.  - Call for new or changing  lesions.  LENTIGINES, SEBORRHEIC KERATOSES, HEMANGIOMAS - Benign normal skin lesions - Benign-appearing - Call for any changes  MELANOCYTIC NEVI - Tan-brown and/or pink-flesh-colored symmetric macules and papules - Benign appearing on exam today - Observation - Call clinic for new or changing moles - Recommend daily use of broad spectrum spf 30+ sunscreen to sun-exposed areas.   STASIS DERMATITIS with SCHAMBERG'S PURPURA Exam: Erythematous, scaly patches involving the ankle and distal lower leg with associated lower leg edema. Chronic and persistent condition with duration or expected duration over one year. Condition is symptomatic / bothersome to patient. Not to goal. Stasis in the legs causes chronic leg swelling, which may result in itchy or painful rashes, skin discoloration, skin texture changes, and sometimes ulceration.  Recommend daily graduated compression hose/stockings- easiest to put on first thing in morning, remove at bedtime.  Elevate legs as much as possible. Avoid salt/sodium rich foods. Treatment Plan: Recommend compression socks qd  Follow up with PCP  Hx of wart of thumb Resolved today Observe for recurrence  INFLAMED SEBORRHEIC KERATOSIS (3) L cheek x 1, L forearm x 1, L lower leg x 1 (3) Symptomatic, irritating, patient would like treated. Destruction of lesion - L cheek x 1, L forearm x 1, L lower leg x 1 (3) Complexity: simple   Destruction method: cryotherapy   Informed consent: discussed and consent obtained   Timeout:  patient name, date of birth, surgical site, and procedure verified Lesion destroyed using liquid nitrogen: Yes   Region frozen until ice ball extended beyond lesion: Yes   Outcome: patient tolerated procedure well with no complications  Post-procedure details: wound care instructions given    Return in about 1 year (around 06/02/2025) for TBSE.  I, Grayce Saunas, RMA, am acting as scribe for Alm Rhyme, MD .   Documentation: I  have reviewed the above documentation for accuracy and completeness, and I agree with the above.  Alm Rhyme, MD

## 2024-06-02 NOTE — Patient Instructions (Addendum)

## 2025-06-02 ENCOUNTER — Ambulatory Visit: Admitting: Dermatology
